# Patient Record
Sex: Male | Born: 1935 | Race: White | Hispanic: No | Marital: Married | Smoking: Never smoker
Health system: Southern US, Community
[De-identification: ages and names within clinical notes are randomized; demographics above are authoritative.]

## PROBLEM LIST (undated history)

## (undated) DIAGNOSIS — M1990 Unspecified osteoarthritis, unspecified site: Secondary | ICD-10-CM

## (undated) DIAGNOSIS — E119 Type 2 diabetes mellitus without complications: Secondary | ICD-10-CM

## (undated) DIAGNOSIS — C801 Malignant (primary) neoplasm, unspecified: Secondary | ICD-10-CM

## (undated) DIAGNOSIS — I519 Heart disease, unspecified: Secondary | ICD-10-CM

## (undated) HISTORY — DX: Unspecified osteoarthritis, unspecified site: M19.90

## (undated) HISTORY — DX: Type 2 diabetes mellitus without complications: E11.9

## (undated) HISTORY — DX: Malignant (primary) neoplasm, unspecified: C80.1

## (undated) HISTORY — DX: Heart disease, unspecified: I51.9

## (undated) HISTORY — PX: OTHER SURGICAL HISTORY: SHX169

---

## 2005-11-07 ENCOUNTER — Ambulatory Visit: Payer: Self-pay | Admitting: Unknown Physician Specialty

## 2008-10-24 ENCOUNTER — Emergency Department: Payer: Self-pay | Admitting: Emergency Medicine

## 2008-11-07 ENCOUNTER — Emergency Department: Payer: Self-pay | Admitting: Internal Medicine

## 2012-02-26 ENCOUNTER — Ambulatory Visit: Payer: Self-pay | Admitting: Internal Medicine

## 2012-09-17 ENCOUNTER — Encounter: Payer: Self-pay | Admitting: Nurse Practitioner

## 2012-09-17 ENCOUNTER — Encounter: Payer: Self-pay | Admitting: Cardiothoracic Surgery

## 2013-01-04 ENCOUNTER — Ambulatory Visit: Payer: Self-pay | Admitting: Internal Medicine

## 2013-01-10 ENCOUNTER — Emergency Department: Payer: Self-pay | Admitting: Emergency Medicine

## 2013-01-10 LAB — CBC
HCT: 38.9 % — ABNORMAL LOW (ref 40.0–52.0)
HGB: 13.2 g/dL (ref 13.0–18.0)
MCHC: 33.9 g/dL (ref 32.0–36.0)
MCV: 95 fL (ref 80–100)
RDW: 15.6 % — ABNORMAL HIGH (ref 11.5–14.5)
WBC: 12.2 10*3/uL — ABNORMAL HIGH (ref 3.8–10.6)

## 2013-01-10 LAB — URINALYSIS, COMPLETE
Bilirubin,UR: NEGATIVE
Glucose,UR: NEGATIVE mg/dL (ref 0–75)
Ketone: NEGATIVE
Leukocyte Esterase: NEGATIVE
Nitrite: NEGATIVE
Ph: 5 (ref 4.5–8.0)
Squamous Epithelial: NONE SEEN
WBC UR: NONE SEEN /HPF (ref 0–5)

## 2013-01-10 LAB — COMPREHENSIVE METABOLIC PANEL
Albumin: 3.6 g/dL (ref 3.4–5.0)
Alkaline Phosphatase: 61 U/L (ref 50–136)
Anion Gap: 16 (ref 7–16)
Bilirubin,Total: 0.7 mg/dL (ref 0.2–1.0)
Co2: 17 mmol/L — ABNORMAL LOW (ref 21–32)
EGFR (African American): 43 — ABNORMAL LOW
EGFR (Non-African Amer.): 37 — ABNORMAL LOW
SGOT(AST): 23 U/L (ref 15–37)
Sodium: 137 mmol/L (ref 136–145)
Total Protein: 7 g/dL (ref 6.4–8.2)

## 2013-01-10 LAB — TROPONIN I: Troponin-I: <0.02 ng/mL

## 2013-01-14 ENCOUNTER — Ambulatory Visit: Payer: Self-pay | Admitting: Internal Medicine

## 2013-01-18 ENCOUNTER — Inpatient Hospital Stay: Payer: Self-pay | Admitting: Internal Medicine

## 2013-01-18 LAB — CBC
HGB: 15.3 g/dL (ref 13.0–18.0)
MCH: 31.2 pg (ref 26.0–34.0)
MCHC: 32.9 g/dL (ref 32.0–36.0)
MCV: 95 fL (ref 80–100)
Platelet: 165 10*3/uL (ref 150–440)
RBC: 4.92 10*6/uL (ref 4.40–5.90)

## 2013-01-18 LAB — URINALYSIS, COMPLETE
Bilirubin,UR: NEGATIVE
Hyaline Cast: 4
Leukocyte Esterase: NEGATIVE
Ph: 5 (ref 4.5–8.0)
Protein: 30
RBC,UR: <1 /HPF (ref 0–5)
Squamous Epithelial: NONE SEEN
WBC UR: 1 /HPF (ref 0–5)

## 2013-01-18 LAB — COMPREHENSIVE METABOLIC PANEL
Albumin: 3.2 g/dL — ABNORMAL LOW (ref 3.4–5.0)
Anion Gap: 19 — ABNORMAL HIGH (ref 7–16)
Bilirubin,Total: 0.5 mg/dL (ref 0.2–1.0)
Chloride: 102 mmol/L (ref 98–107)
Creatinine: 3.71 mg/dL — ABNORMAL HIGH (ref 0.60–1.30)
EGFR (Non-African Amer.): 14 — ABNORMAL LOW
Glucose: 144 mg/dL — ABNORMAL HIGH (ref 65–99)
Osmolality: 322 (ref 275–301)

## 2013-01-18 LAB — TROPONIN I: Troponin-I: <0.02 ng/mL

## 2013-01-19 LAB — CBC WITH DIFFERENTIAL/PLATELET
Basophil #: 0 10*3/uL (ref 0.0–0.1)
Basophil %: 0.1 %
Eosinophil #: 0 10*3/uL (ref 0.0–0.7)
Eosinophil %: 0.3 %
Lymphocyte #: 0.7 10*3/uL — ABNORMAL LOW (ref 1.0–3.6)
MCV: 93 fL (ref 80–100)
Monocyte %: 10.1 %
Neutrophil #: 10.1 10*3/uL — ABNORMAL HIGH (ref 1.4–6.5)
Neutrophil %: 83.4 %
Platelet: 124 10*3/uL — ABNORMAL LOW (ref 150–440)
RBC: 4.08 10*6/uL — ABNORMAL LOW (ref 4.40–5.90)
WBC: 12.1 10*3/uL — ABNORMAL HIGH (ref 3.8–10.6)

## 2013-01-19 LAB — COMPREHENSIVE METABOLIC PANEL
Albumin: 2.4 g/dL — ABNORMAL LOW (ref 3.4–5.0)
Alkaline Phosphatase: 47 U/L — ABNORMAL LOW (ref 50–136)
Creatinine: 3.19 mg/dL — ABNORMAL HIGH (ref 0.60–1.30)
EGFR (African American): 19 — ABNORMAL LOW
Glucose: 100 mg/dL — ABNORMAL HIGH (ref 65–99)
Osmolality: 326 (ref 275–301)
Potassium: 4.7 mmol/L (ref 3.5–5.1)
SGOT(AST): 11 U/L — ABNORMAL LOW (ref 15–37)
SGPT (ALT): 16 U/L (ref 12–78)
Sodium: 141 mmol/L (ref 136–145)

## 2013-01-20 LAB — PHOSPHORUS: Phosphorus: 3.6 mg/dL (ref 2.5–4.9)

## 2013-01-20 LAB — CBC WITH DIFFERENTIAL/PLATELET
Eosinophil #: 0 10*3/uL (ref 0.0–0.7)
Eosinophil %: 0.4 %
HCT: 38 % — ABNORMAL LOW (ref 40.0–52.0)
HGB: 13 g/dL (ref 13.0–18.0)
Lymphocyte #: 0.8 10*3/uL — ABNORMAL LOW (ref 1.0–3.6)
Lymphocyte %: 7.3 %
MCHC: 34.2 g/dL (ref 32.0–36.0)
MCV: 93 fL (ref 80–100)
Neutrophil #: 9.1 10*3/uL — ABNORMAL HIGH (ref 1.4–6.5)
Neutrophil %: 81.3 %
RBC: 4.1 10*6/uL — ABNORMAL LOW (ref 4.40–5.90)
RDW: 15.2 % — ABNORMAL HIGH (ref 11.5–14.5)
WBC: 11.2 10*3/uL — ABNORMAL HIGH (ref 3.8–10.6)

## 2013-01-20 LAB — COMPREHENSIVE METABOLIC PANEL
Albumin: 2.4 g/dL — ABNORMAL LOW (ref 3.4–5.0)
Alkaline Phosphatase: 57 U/L (ref 50–136)
Bilirubin,Total: 0.5 mg/dL (ref 0.2–1.0)
Calcium, Total: 6.7 mg/dL — CL (ref 8.5–10.1)
Co2: 18 mmol/L — ABNORMAL LOW (ref 21–32)
Creatinine: 2.41 mg/dL — ABNORMAL HIGH (ref 0.60–1.30)
EGFR (African American): 27 — ABNORMAL LOW
EGFR (Non-African Amer.): 23 — ABNORMAL LOW
Glucose: 220 mg/dL — ABNORMAL HIGH (ref 65–99)
Potassium: 3.6 mmol/L (ref 3.5–5.1)
SGOT(AST): 29 U/L (ref 15–37)
SGPT (ALT): 18 U/L (ref 12–78)
Sodium: 143 mmol/L (ref 136–145)

## 2013-01-21 LAB — CBC WITH DIFFERENTIAL/PLATELET
Basophil #: 0 10*3/uL (ref 0.0–0.1)
Eosinophil #: 0.1 10*3/uL (ref 0.0–0.7)
Eosinophil %: 1.1 %
HCT: 37.4 % — ABNORMAL LOW (ref 40.0–52.0)
HGB: 12.2 g/dL — ABNORMAL LOW (ref 13.0–18.0)
Lymphocyte #: 0.8 10*3/uL — ABNORMAL LOW (ref 1.0–3.6)
MCH: 30 pg (ref 26.0–34.0)
MCV: 92 fL (ref 80–100)
Monocyte #: 0.7 x10 3/mm (ref 0.2–1.0)
Monocyte %: 9.2 %
Neutrophil %: 77.4 %

## 2013-01-21 LAB — COMPREHENSIVE METABOLIC PANEL
Albumin: 2.2 g/dL — ABNORMAL LOW (ref 3.4–5.0)
Bilirubin,Total: 0.5 mg/dL (ref 0.2–1.0)
Calcium, Total: 6.8 mg/dL — CL (ref 8.5–10.1)
Chloride: 109 mmol/L — ABNORMAL HIGH (ref 98–107)
Creatinine: 1.23 mg/dL (ref 0.60–1.30)
EGFR (African American): 60 — ABNORMAL LOW
Osmolality: 306 (ref 275–301)
Potassium: 2.7 mmol/L — ABNORMAL LOW (ref 3.5–5.1)
SGPT (ALT): 18 U/L (ref 12–78)
Sodium: 146 mmol/L — ABNORMAL HIGH (ref 136–145)
Total Protein: 4.9 g/dL — ABNORMAL LOW (ref 6.4–8.2)

## 2013-01-22 LAB — COMPREHENSIVE METABOLIC PANEL
Albumin: 2 g/dL — ABNORMAL LOW (ref 3.4–5.0)
Alkaline Phosphatase: 53 U/L (ref 50–136)
Anion Gap: 3 — ABNORMAL LOW (ref 7–16)
BUN: 23 mg/dL — ABNORMAL HIGH (ref 7–18)
Bilirubin,Total: 0.5 mg/dL (ref 0.2–1.0)
Creatinine: 1 mg/dL (ref 0.60–1.30)
Glucose: 149 mg/dL — ABNORMAL HIGH (ref 65–99)
Osmolality: 299 (ref 275–301)
SGOT(AST): 27 U/L (ref 15–37)
Sodium: 147 mmol/L — ABNORMAL HIGH (ref 136–145)
Total Protein: 4.8 g/dL — ABNORMAL LOW (ref 6.4–8.2)

## 2013-01-22 LAB — CBC WITH DIFFERENTIAL/PLATELET
Lymphocytes: 12 %
MCH: 31.6 pg (ref 26.0–34.0)
MCHC: 33.5 g/dL (ref 32.0–36.0)
MCV: 94 fL (ref 80–100)
Platelet: 82 10*3/uL — ABNORMAL LOW (ref 150–440)
Segmented Neutrophils: 77 %
WBC: 5.5 10*3/uL (ref 3.8–10.6)

## 2013-01-22 LAB — MAGNESIUM: Magnesium: 1.2 mg/dL — ABNORMAL LOW

## 2013-01-22 LAB — TSH: Thyroid Stimulating Horm: 0.42 u[IU]/mL — ABNORMAL LOW

## 2013-01-23 LAB — COMPREHENSIVE METABOLIC PANEL
Albumin: 2.2 g/dL — ABNORMAL LOW (ref 3.4–5.0)
BUN: 15 mg/dL (ref 7–18)
Bilirubin,Total: 0.5 mg/dL (ref 0.2–1.0)
Calcium, Total: 7.2 mg/dL — ABNORMAL LOW (ref 8.5–10.1)
Creatinine: 0.9 mg/dL (ref 0.60–1.30)
EGFR (Non-African Amer.): >60
Osmolality: 288 (ref 275–301)
Potassium: 3.2 mmol/L — ABNORMAL LOW (ref 3.5–5.1)

## 2013-01-23 LAB — CBC WITH DIFFERENTIAL/PLATELET
Basophil #: 0.1 10*3/uL (ref 0.0–0.1)
Lymphocyte #: 0.9 10*3/uL — ABNORMAL LOW (ref 1.0–3.6)
MCH: 31.9 pg (ref 26.0–34.0)
MCHC: 33.8 g/dL (ref 32.0–36.0)
Neutrophil #: 5.7 10*3/uL (ref 1.4–6.5)
Neutrophil %: 79.7 %
Platelet: 86 10*3/uL — ABNORMAL LOW (ref 150–440)
RBC: 3.97 10*6/uL — ABNORMAL LOW (ref 4.40–5.90)
RDW: 15 % — ABNORMAL HIGH (ref 11.5–14.5)
WBC: 7.2 10*3/uL (ref 3.8–10.6)

## 2013-01-23 LAB — CULTURE, BLOOD (SINGLE)

## 2013-01-24 LAB — CBC WITH DIFFERENTIAL/PLATELET
Basophil #: 0 10*3/uL (ref 0.0–0.1)
Basophil %: 0.5 %
Eosinophil #: 0.1 10*3/uL (ref 0.0–0.7)
HCT: 35.2 % — ABNORMAL LOW (ref 40.0–52.0)
HGB: 12.1 g/dL — ABNORMAL LOW (ref 13.0–18.0)
Lymphocyte #: 0.7 10*3/uL — ABNORMAL LOW (ref 1.0–3.6)
Lymphocyte %: 8.8 %
MCHC: 34.4 g/dL (ref 32.0–36.0)
MCV: 94 fL (ref 80–100)
Monocyte #: 0.4 x10 3/mm (ref 0.2–1.0)
Monocyte %: 5.1 %
Neutrophil #: 6.8 10*3/uL — ABNORMAL HIGH (ref 1.4–6.5)
Platelet: 89 10*3/uL — ABNORMAL LOW (ref 150–440)
RDW: 15.1 % — ABNORMAL HIGH (ref 11.5–14.5)

## 2013-01-24 LAB — COMPREHENSIVE METABOLIC PANEL
Albumin: 2.2 g/dL — ABNORMAL LOW (ref 3.4–5.0)
Alkaline Phosphatase: 58 U/L (ref 50–136)
BUN: 11 mg/dL (ref 7–18)
Bilirubin,Total: 0.4 mg/dL (ref 0.2–1.0)
Calcium, Total: 7.5 mg/dL — ABNORMAL LOW (ref 8.5–10.1)
Chloride: 109 mmol/L — ABNORMAL HIGH (ref 98–107)
Co2: 29 mmol/L (ref 21–32)
EGFR (African American): >60
EGFR (Non-African Amer.): >60
Potassium: 3.7 mmol/L (ref 3.5–5.1)
SGOT(AST): 21 U/L (ref 15–37)
SGPT (ALT): 29 U/L (ref 12–78)

## 2013-01-25 LAB — CBC WITH DIFFERENTIAL/PLATELET
Basophil #: 0 10*3/uL (ref 0.0–0.1)
Basophil %: 0.3 %
Eosinophil #: 0.1 10*3/uL (ref 0.0–0.7)
Eosinophil %: 1 %
HCT: 38.4 % — ABNORMAL LOW (ref 40.0–52.0)
Lymphocyte %: 7.2 %
MCH: 31.9 pg (ref 26.0–34.0)
MCHC: 33.7 g/dL (ref 32.0–36.0)
MCV: 95 fL (ref 80–100)
Monocyte #: 0.6 x10 3/mm (ref 0.2–1.0)
Monocyte %: 5.6 %
Neutrophil #: 9.1 10*3/uL — ABNORMAL HIGH (ref 1.4–6.5)
Neutrophil %: 85.9 %
Platelet: 97 10*3/uL — ABNORMAL LOW (ref 150–440)
RBC: 4.05 10*6/uL — ABNORMAL LOW (ref 4.40–5.90)
WBC: 10.6 10*3/uL (ref 3.8–10.6)

## 2013-01-25 LAB — COMPREHENSIVE METABOLIC PANEL
Albumin: 2.3 g/dL — ABNORMAL LOW (ref 3.4–5.0)
Alkaline Phosphatase: 75 U/L (ref 50–136)
Anion Gap: 7 (ref 7–16)
BUN: 9 mg/dL (ref 7–18)
Bilirubin,Total: 0.4 mg/dL (ref 0.2–1.0)
Chloride: 107 mmol/L (ref 98–107)
Co2: 25 mmol/L (ref 21–32)
Creatinine: 0.96 mg/dL (ref 0.60–1.30)
Glucose: 156 mg/dL — ABNORMAL HIGH (ref 65–99)
Osmolality: 279 (ref 275–301)
Potassium: 4.6 mmol/L (ref 3.5–5.1)
Sodium: 139 mmol/L (ref 136–145)
Total Protein: 5.7 g/dL — ABNORMAL LOW (ref 6.4–8.2)

## 2013-01-26 LAB — COMPREHENSIVE METABOLIC PANEL
Albumin: 2.5 g/dL — ABNORMAL LOW (ref 3.4–5.0)
Alkaline Phosphatase: 166 U/L — ABNORMAL HIGH (ref 50–136)
BUN: 13 mg/dL (ref 7–18)
Bilirubin,Total: 1.1 mg/dL — ABNORMAL HIGH (ref 0.2–1.0)
Chloride: 108 mmol/L — ABNORMAL HIGH (ref 98–107)
Glucose: 170 mg/dL — ABNORMAL HIGH (ref 65–99)
Potassium: 5.8 mmol/L — ABNORMAL HIGH (ref 3.5–5.1)
SGOT(AST): 239 U/L — ABNORMAL HIGH (ref 15–37)
SGPT (ALT): 165 U/L — ABNORMAL HIGH (ref 12–78)
Sodium: 136 mmol/L (ref 136–145)
Total Protein: 6 g/dL — ABNORMAL LOW (ref 6.4–8.2)

## 2013-01-26 LAB — URINALYSIS, COMPLETE
Bilirubin,UR: NEGATIVE
Glucose,UR: 50 mg/dL (ref 0–75)
Hyaline Cast: 5
Nitrite: NEGATIVE
Ph: 5 (ref 4.5–8.0)
Protein: 100
RBC,UR: 1 /HPF (ref 0–5)
Specific Gravity: 1.021 (ref 1.003–1.030)
Squamous Epithelial: 1
WBC UR: 5 /HPF (ref 0–5)

## 2013-01-26 LAB — BASIC METABOLIC PANEL
BUN: 9 mg/dL (ref 7–18)
Co2: 25 mmol/L (ref 21–32)
EGFR (African American): >60
EGFR (Non-African Amer.): >60
Osmolality: 272 (ref 275–301)
Potassium: 5.2 mmol/L — ABNORMAL HIGH (ref 3.5–5.1)

## 2013-01-26 LAB — CBC WITH DIFFERENTIAL/PLATELET
Basophil #: 0 10*3/uL (ref 0.0–0.1)
Basophil #: 0 10*3/uL (ref 0.0–0.1)
Basophil %: 0.4 %
Basophil %: 0.1 %
Eosinophil #: 0 10*3/uL (ref 0.0–0.7)
Lymphocyte %: 7.4 %
MCHC: 32.8 g/dL (ref 32.0–36.0)
MCHC: 32.2 g/dL (ref 32.0–36.0)
MCV: 96 fL (ref 80–100)
Monocyte #: 0.8 x10 3/mm (ref 0.2–1.0)
Monocyte #: 1.1 x10 3/mm — ABNORMAL HIGH (ref 0.2–1.0)
Monocyte %: 5.7 %
Monocyte %: 8.2 %
Platelet: 102 10*3/uL — ABNORMAL LOW (ref 150–440)
Platelet: 112 10*3/uL — ABNORMAL LOW (ref 150–440)
RBC: 4.13 10*6/uL — ABNORMAL LOW (ref 4.40–5.90)
RDW: 15 % — ABNORMAL HIGH (ref 11.5–14.5)
WBC: 13.3 10*3/uL — ABNORMAL HIGH (ref 3.8–10.6)
WBC: 13.3 10*3/uL — ABNORMAL HIGH (ref 3.8–10.6)

## 2013-01-27 LAB — COMPREHENSIVE METABOLIC PANEL
Anion Gap: 6 — ABNORMAL LOW (ref 7–16)
Bilirubin,Total: 1.1 mg/dL — ABNORMAL HIGH (ref 0.2–1.0)
EGFR (African American): >60
EGFR (Non-African Amer.): >60
Glucose: 97 mg/dL (ref 65–99)
Osmolality: 278 (ref 275–301)
Potassium: 4.6 mmol/L (ref 3.5–5.1)
Sodium: 139 mmol/L (ref 136–145)
Total Protein: 5.3 g/dL — ABNORMAL LOW (ref 6.4–8.2)

## 2013-01-27 LAB — CBC WITH DIFFERENTIAL/PLATELET
Basophil #: 0 10*3/uL (ref 0.0–0.1)
Eosinophil %: 0.2 %
HCT: 38.6 % — ABNORMAL LOW (ref 40.0–52.0)
HGB: 11.8 g/dL — ABNORMAL LOW (ref 13.0–18.0)
Lymphocyte #: 0.9 10*3/uL — ABNORMAL LOW (ref 1.0–3.6)
Lymphocyte %: 7.6 %
Neutrophil #: 10.1 10*3/uL — ABNORMAL HIGH (ref 1.4–6.5)
WBC: 12 10*3/uL — ABNORMAL HIGH (ref 3.8–10.6)

## 2013-01-30 LAB — CBC WITH DIFFERENTIAL/PLATELET
Basophil #: 0 10*3/uL (ref 0.0–0.1)
Eosinophil #: 0.1 10*3/uL (ref 0.0–0.7)
HGB: 11.2 g/dL — ABNORMAL LOW (ref 13.0–18.0)
Lymphocyte #: 0.7 10*3/uL — ABNORMAL LOW (ref 1.0–3.6)
Lymphocyte %: 11.6 %
MCH: 30.8 pg (ref 26.0–34.0)
MCHC: 32.3 g/dL (ref 32.0–36.0)
Monocyte #: 0.5 x10 3/mm (ref 0.2–1.0)
Monocyte %: 7.9 %
Neutrophil #: 4.9 10*3/uL (ref 1.4–6.5)
Neutrophil %: 78.2 %
Platelet: 82 10*3/uL — ABNORMAL LOW (ref 150–440)
RDW: 15.6 % — ABNORMAL HIGH (ref 11.5–14.5)
WBC: 6.2 10*3/uL (ref 3.8–10.6)

## 2013-01-30 LAB — COMPREHENSIVE METABOLIC PANEL
Albumin: 2.2 g/dL — ABNORMAL LOW (ref 3.4–5.0)
Alkaline Phosphatase: 117 U/L (ref 50–136)
Anion Gap: 3 — ABNORMAL LOW (ref 7–16)
Bilirubin,Total: 0.5 mg/dL (ref 0.2–1.0)
Calcium, Total: 8.1 mg/dL — ABNORMAL LOW (ref 8.5–10.1)
Chloride: 106 mmol/L (ref 98–107)
Creatinine: 0.95 mg/dL (ref 0.60–1.30)
EGFR (African American): >60
EGFR (Non-African Amer.): >60
Glucose: 93 mg/dL (ref 65–99)
Osmolality: 273 (ref 275–301)
Sodium: 137 mmol/L (ref 136–145)

## 2013-02-04 ENCOUNTER — Ambulatory Visit: Payer: Self-pay | Admitting: Internal Medicine

## 2013-05-25 ENCOUNTER — Encounter: Payer: Self-pay | Admitting: Podiatry

## 2013-05-30 ENCOUNTER — Ambulatory Visit: Payer: Self-pay | Admitting: Podiatry

## 2013-10-24 ENCOUNTER — Encounter: Payer: Self-pay | Admitting: Podiatry

## 2013-10-24 ENCOUNTER — Ambulatory Visit (INDEPENDENT_AMBULATORY_CARE_PROVIDER_SITE_OTHER): Payer: Medicare Other | Admitting: Podiatry

## 2013-10-24 VITALS — BP 135/85 | HR 96 | Resp 18 | Ht 68.0 in | Wt 176.0 lb

## 2013-10-24 DIAGNOSIS — B351 Tinea unguium: Secondary | ICD-10-CM

## 2013-10-24 DIAGNOSIS — M79609 Pain in unspecified limb: Secondary | ICD-10-CM

## 2013-10-24 NOTE — Progress Notes (Signed)
   Subjective:    Patient ID: Troy Orr, male    DOB: 08/30/1921, 80 y.o.   MRN: [id number]  HPI Comments: Just a regular visit and i have a cut on my toe. Left great toe.  Diabetes      Review of Systems     Objective:   Physical Exam: I have reviewed his past history medications allergies surgeries social history. Pulses remain palpable bilateral there is no erythema edema saline is drainage or odor. His nails are thick yellow dystrophic onychomycotic and painful palpation.        Assessment & Plan:  Assessment: Pain in limb secondary to onychomycosis 1 through 5 bilateral.  Plan: Debridement of nails 1 through 5 bilateral is cover service secondary to pain.

## 2013-12-09 ENCOUNTER — Ambulatory Visit (INDEPENDENT_AMBULATORY_CARE_PROVIDER_SITE_OTHER): Payer: Medicare Other | Admitting: Podiatry

## 2013-12-09 ENCOUNTER — Encounter: Payer: Self-pay | Admitting: Podiatry

## 2013-12-09 VITALS — BP 125/86 | HR 99 | Resp 20

## 2013-12-09 DIAGNOSIS — L97509 Non-pressure chronic ulcer of other part of unspecified foot with unspecified severity: Secondary | ICD-10-CM

## 2013-12-09 NOTE — Progress Notes (Signed)
Subjective:     Patient ID: Troy Orr, male   DOB: 1921-12-21, 80 y.o.   MRN: [id number]  HPI patient presents stating he's concerned because there's been a blister with breakdown on his right big toe and he is a diabetic   Review of Systems     Objective:   Physical Exam Neurovascular status unchanged with some redness on the dorsum of the right hallux localized in nature with a previous blister with no indications currently of subcutaneous exposure or proximal signs of infection    Assessment:     Localized irritation of tissue with no indications of proximal spread    Plan:     Explained soaks and applied Silvadene with dressing today. Reappoint if anything changes

## 2013-12-30 ENCOUNTER — Encounter: Payer: Self-pay | Admitting: Surgery

## 2014-01-04 ENCOUNTER — Encounter: Payer: Self-pay | Admitting: Surgery

## 2014-01-09 ENCOUNTER — Encounter: Payer: Self-pay | Admitting: Surgery

## 2014-01-23 ENCOUNTER — Ambulatory Visit: Payer: Medicare Other | Admitting: Podiatry

## 2014-02-04 ENCOUNTER — Encounter: Payer: Self-pay | Admitting: Surgery

## 2014-04-14 ENCOUNTER — Ambulatory Visit (INDEPENDENT_AMBULATORY_CARE_PROVIDER_SITE_OTHER): Payer: Medicare Other | Admitting: Podiatry

## 2014-04-14 DIAGNOSIS — Q828 Other specified congenital malformations of skin: Secondary | ICD-10-CM

## 2014-04-14 DIAGNOSIS — E1151 Type 2 diabetes mellitus with diabetic peripheral angiopathy without gangrene: Secondary | ICD-10-CM

## 2014-04-14 DIAGNOSIS — B351 Tinea unguium: Secondary | ICD-10-CM

## 2014-04-14 DIAGNOSIS — M79676 Pain in unspecified toe(s): Secondary | ICD-10-CM

## 2014-04-14 NOTE — Progress Notes (Signed)
Subjective:     Patient ID: Erez W Jimmerson, male   DOB: 06/18/1922, 80 y.o.   MRN: [id number]  HPI patient presents stating that I have these nails they get sore and this lesion on my right fifth metatarsal and I have been at the wound care center and I'm finally doing well   Review of Systems     Objective:   Physical Exam Neurovascular status diminished but unchanged and he's been seen at the vein in vascular center with thick toenail disease 1-5 both and keratotic lesion sub-fifth metatarsal that's very tender when pressed    Assessment:     Plantarflexed metatarsal with keratotic lesion and nail disease with pain 1-5 both feet    Plan:     Debris painful nailbeds 1-5 both feet and lesion plantar aspect right foot with no iatrogenic bleeding noted      

## 2014-07-14 ENCOUNTER — Other Ambulatory Visit: Payer: Medicare Other

## 2014-07-24 ENCOUNTER — Ambulatory Visit (INDEPENDENT_AMBULATORY_CARE_PROVIDER_SITE_OTHER): Payer: Medicare Other | Admitting: Podiatry

## 2014-07-24 DIAGNOSIS — M79676 Pain in unspecified toe(s): Secondary | ICD-10-CM

## 2014-07-24 DIAGNOSIS — B351 Tinea unguium: Secondary | ICD-10-CM | POA: Diagnosis not present

## 2014-07-24 NOTE — Progress Notes (Signed)
Subjective:     Patient ID: Troy Orr, male   DOB: [birth year], 81 y.o.   MRN: [id number]  HPI patient presents stating that I have these nails they get sore and this lesion on my right fifth metatarsal and I have been at the wound care center and I'm finally doing well   Review of Systems     Objective:   Physical Exam Neurovascular status diminished but unchanged and he's been seen at the vein in vascular center with thick toenail disease 1-5 both and keratotic lesion sub-fifth metatarsal that's very tender when pressed    Assessment:     Plantarflexed metatarsal with keratotic lesion and nail disease with pain 1-5 both feet    Plan:     Debris painful nailbeds 1-5 both feet and lesion plantar aspect right foot with no iatrogenic bleeding noted

## 2014-10-23 ENCOUNTER — Ambulatory Visit: Payer: Medicare Other

## 2014-10-23 ENCOUNTER — Ambulatory Visit (INDEPENDENT_AMBULATORY_CARE_PROVIDER_SITE_OTHER): Payer: Medicare Other | Admitting: Podiatry

## 2014-10-23 DIAGNOSIS — M79676 Pain in unspecified toe(s): Secondary | ICD-10-CM

## 2014-10-23 DIAGNOSIS — B351 Tinea unguium: Secondary | ICD-10-CM | POA: Diagnosis not present

## 2014-10-23 MED ORDER — AMOXICILLIN-POT CLAVULANATE 500-125 MG PO TABS: 1.0000 | ORAL_TABLET | Freq: Three times a day (TID) | ORAL | Status: DC

## 2014-10-23 MED ORDER — MUPIROCIN 2 % EX OINT: TOPICAL_OINTMENT | CUTANEOUS | Status: DC

## 2014-10-23 NOTE — Progress Notes (Signed)
Subjective:     Patient ID: Troy Orr, male   DOB: 02/04/1922, 81 y.o.   MRN: [id number]  HPI patient presents stating that I have these nails they get sore and this lesion on my right fifth metatarsal and I have been at the wound care center and I'm finally doing well   Review of Systems     Objective:   Physical Exam Neurovascular status diminished but unchanged and he's been seen at the vein in vascular center with thick toenail disease 1-5 both and keratotic lesion sub-fifth metatarsal that's very tender when pressed    Assessment:     Plantarflexed metatarsal with keratotic lesion and nail disease with pain 1-5 both feet    Plan:     Debris painful nailbeds 1-5 both feet and lesion plantar aspect right foot with no iatrogenic bleeding noted

## 2014-10-27 NOTE — Consult Note (Signed)
   Comments   I met with pt's son, Caide Campi. Updated him on pt's current medical condition including AM labs. Son understands that pt is approaching the end of life. I discussed transitioning pt to comfort care and son is in agreement. Orders entered.   Electronic Signatures: Nadiah Corbit, Izora Gala (MD)  (Signed 24-Jul-14 13:42)  Authored: Palliative Care   Last Updated: 24-Jul-14 13:42 by Ramir Malerba, Izora Gala (MD)

## 2014-10-27 NOTE — Consult Note (Signed)
Brief Consult Note: Diagnosis: elevated LFTs, acute renal failure, AMS, weakness.   Patient was seen by consultant.   Consult note dictated.   Orders entered.   Discussed with Attending MD.   Comments: Patient seen and examined. Patient has been inpt since 01/18/13 for workup and treatment of acute renal failure, with little urine output, weakness, and nausea. LFTs have been WNL up until yesterday. Transaminitis began yesterday, and continued to increase today. AlkPhos and Bili remain WNL. No remarkable WBC count. VSS.  [place] was obtained noting mobile gallstones, but CBD 6.36mm and WNL, no pericholecystic fluid, unremarkable liver contour.  Differential diagnosis was discussed. may be Medication/Drug-induced, also can consider Ischemic injury/early liver failure considering the patient has been having intermittent Hypotension since admission.  Recc an Ultrasound Doppler, r/o thombus. Will also check PT/INR, ammonia level. Repeat Liver panel in the am.  Will follow closely.  Case discussed with Dr Rayann Heman. Full consult being dictated.  ADDENDUM: Received call to cancel GI consult due to clinical decline and pt return to comfort care/hospice, but consult already completed. Will sign off on the case, but we will be happy to reconsult should his clinical picture change. thanks..  Electronic Signatures: Loren Racer M (PA-C)  (Signed 24-Jul-14 15:17)  Authored: Brief Consult Note   Last Updated: 24-Jul-14 15:17 by Sherald Barge (PA-C)

## 2014-10-27 NOTE — Discharge Summary (Signed)
PATIENT NAME:  Troy Orr, Troy Orr MR#:  [id number] DATE OF BIRTH:  07/03/1922  DATE OF ADMISSION:  01/18/2013 DATE OF DISCHARGE:  01/31/2013  DISCHARGE DIAGNOSES: 1.  Acute small bowel obstruction, partial.  2.  Abnormal liver function tests.  3.  Chronic atrial fibrillation.  4.  Diabetes mellitus, non-insulin-requiring.  5.  Cardiomyopathy, ejection fraction of 40%.   DISCHARGE MEDICATIONS:  Aspirin 81 mg daily. Metoprolol succinate 50 mg daily. Omeprazole 20 mg daily.   REASON FOR ADMISSION:  An 81 year old male presents with severe abdominal pain. Please see H and P for HPI, past medical history and physical exam.   HOSPITAL COURSE: The patient was admitted, found to have possible partial small bowel obstruction. His LFTs went up into the mid 100s. The etiology was uncertain. His medications were held. He was actually comfort care, but then he turned the corner. His bowels improved. His LFTs started coming down. His mental status abnormalities resolved, other than still being weak. His appetite is good, and he is making what seems to be a reasonably fair full recovery. He will need skilled nursing to strengthen him with his still slightly abnormal LFTs, around 60 ALT. His Januvia and cholesterol medicine will be held. His blood pressure was lowish, and thus, he is not on his Ramipril or his Norvasc at this point. With the fact that this could have been intestinal ischemia, his aspirin is re-added with his omeprazole. Sugars will be followed. Metformin 500 mg q.a.m. added if his fasting sugars go over 140. Overall prognosis is guarded.   ____________________________ Rusty Aus, MD mfm:dmm D: 01/31/2013 09:24:16 ET T: 01/31/2013 09:31:02 ET JOB#: [id number]  cc: Rusty Aus, MD, <Dictator> Kinnick Maus Roselee Culver MD ELECTRONICALLY SIGNED 02/01/2013 7:55

## 2014-10-27 NOTE — H&P (Signed)
PATIENT NAME:  Troy Orr, Troy Orr MR#:  [id number] DATE OF BIRTH:  12-31-1921  DATE OF ADMISSION:  01/18/2013  PRIMARY CARE PHYSICIAN: Dr. Georgie Chard.   CHIEF COMPLAINT: Weakness, nausea.   HISTORY OF PRESENT ILLNESS: This is an 81 year old male who resides at [facility] assisted [facility] who was brought to the Emergency Room because of progressive nausea with weakness. Had been seen previously on the 7th of this month with similar symptoms, found to have mildly acute renal failure with a creatinine of 1.61, but presented this time with progress renal failure with a creatinine of 3.71 and BUN of 160, and also found to be in metabolic acidosis with a CO2 of 12, and a lactic acid of 1.9 and a potassium 5.8. At that point, we were called to admit the patient for further evaluation and treatment. The patient was also found to be hypotensive with systolic blood pressures in the 70s to 25E and diastolic in the 52D, despite 2 liters of normal saline bolus. Family member and patient denied any altered mental status changes. He does endorse decreased urine output over the past several days. Denies any dysuria or hematuria, and no significant abdominal discomfort. During his ED stay, a CT of the abdomen was obtained and did show concerns for possible obstruction. Surgery was consulted, evaluated the patient, and thought that this was more due to ileus and no significant obstruction. An NG tube was placed, and surgery has signed off otherwise.   PAST MEDICAL HISTORY: 1.  Type 2 diabetes followed by Dr. Gabriel Carina.  2.  Also history of coronary artery disease, status post CABG and mitral valve repair in 1995.  3.  History of colon cancer.  4.  History of cataracts.  5.  History of hyperlipidemia.  6.  History of hypertension.  7.  History of spinal stenosis.   MEDICATIONS:  1.  Aspirin 81 mg daily.  2.  Ramipril 1.25 mg daily.  3.  Lopressor 50 mg b.i.d.  4.  Norvasc 5 mg daily.  5.  Simvastatin 40 mg at  bedtime.  6.  Metformin 1000 mg b.i.d.  7.  Omeprazole 20 mg daily.  8.  Januvia 50 mg daily.  9.  B12 injections.  10.  Omega-3 as directed.  11.  Meclizine 25 mg q.8 hours as needed.   ALLERGIES: No known drug allergies.   SOCIAL HISTORY: He is a widower, resides at [facility] assisted living. No tobacco use and no alcohol use currently.   FAMILY HISTORY: Positive for hypertension and stroke.   REVIEW OF SYSTEMS: As stated above in the history of present illness.   PHYSICAL EXAMINATION: VITAL SIGNS: Temperature 97.8, pulse 78E, systolic blood pressure 70 to 42P, diastolic 53I. He is satting 99% on room air.  GENERAL: This is an elderly white male in no apparent distress, who awakens to voice, and is alert and oriented to person, place and time.  HEENT: Extraocular movements are intact. Pupils are equal and reactive to light and accommodation.  Supple neck exam. Mildly dry oral mucosa.  CARDIOVASCULAR: Regular rate and rhythm. No significant murmurs.  RESPIRATORY: Clear to auscultation bilaterally. No wheezing. No increased work of breathing.  ABDOMEN: Quite distended but soft when palpated, and nontender. No rebound or guarding. NEUROLOGIC:  Again, alert and oriented x 3. Cranial nerves seem to be intact. Has motor control throughout. Sensation seems to be intact.  SKIN: No cyanosis. Normal color. Slight tent and turgor.   PERTINENT LABORATORY AND STUDIES: CT  of the abdomen was performed, did show concerns for possible small bowel dilatation, but no obstructive uropathy. Also had a chest x-ray, showed no acute processes. White blood cell count 16.9, hemoglobin 15.3, platelets of 165. Urinalysis was negative. Sodium 133, potassium 5.8, chloride 102, CO2 of 12, BUN of 160, creatinine 3.71, glucose 144. LFTs within normal limits. Troponin less than 0.02. Lactic acid 1.9.   ASSESSMENT AND PLAN: This is an 81 year old male being admitted for acute progressive renal failure, found to be in  metabolic acidosis and hypotensive with leukocytosis.  1.  Acute renal failure. Unclear etiology at this point. Seems to be progressive.  Last creatinine  on that 01/10/2013 was 1.61. Prior to that, a creatinine on 12/02/2012 was 1.0. Today's creatinine is 3.71 with a BUN of 160. This could be acute tubular necrosis. It could be obstructive, and it also could be  nephritis. Based on his exam, it is concerning for hypovolemia, prerenal. We plan to hydrate him with IV fluids, aggressive hydration at this time. We can use pressors as needed to keep his blood pressure up to perfuse the kidneys. Nephrology has been consulted, and Dr. Candiss Norse has agreed to see the patient. I plan to hold all nephrotoxic medications at this time. Renal ultrasound has been ordered for further evaluation to rule out any obstructive pathology.  2.  Hypotension. Unclear reason for his hypotensive state, sepsis versus hypovolemia. He is being covered with vanc and Zosyn for possible sepsis at this time. He is also being hydrated with normal saline IV fluid boluses. We will hold all hypertensive meds at this time.   3.  Metabolic acidosis. Unclear etiology at this time, likely due to underlying renal failure. We will follow this along as we hydrate him.  4.  Adult onset diabetes. We will hold all oral meds at this time, cover him with sliding scale insulin. He is n.p.o. with possible ileus; therefore, he has NG tube in place.  5.  Fluids, electrolytes, nutrition: The patient is to be n.p.o. Cover with normal saline IV fluids.  6. Prophylaxis. We will cover her PPI via IV. We will also cover with heparin for DVT prophylaxis.   DISPOSITION: I had long discussion with the patient regarding CODE STATUS. He is a DNR/DNI. He is willing to undergo pressors if needed. Given his critical state, I am going to place him in the CCU for close observation and monitoring.  We will turn the patient over to Dr. Doy Hutching in the morning.     ____________________________ Dion Body, MD kl:dmm D: 01/18/2013 20:09:53 ET T: 01/18/2013 20:36:43 ET JOB#: [id number]  cc: Dion Body, MD, <Dictator> Dion Body MD ELECTRONICALLY SIGNED 01/31/2013 11:03

## 2014-10-27 NOTE — Consult Note (Signed)
Brief Consult Note: Diagnosis: renal failure, ileus.   Patient was seen by consultant.   Consult note dictated.   Recommend further assessment or treatment.   Discussed with Attending MD.   Comments: Renal failure and acidosis with nml lactate and no abd pain. Passing gas, had BM yesterday. Occasional small volume emesis over last 2 weeks. CT rev'd, pt examined. No obvious obstruction and suspect ileus. Discussed with Drf Lord. Pt does need NG and reexamination.  Electronic Signatures: Florene Glen (MD)  (Signed 15-Jul-14 18:13)  Authored: Brief Consult Note   Last Updated: 15-Jul-14 18:13 by Florene Glen (MD)

## 2014-10-27 NOTE — Consult Note (Signed)
PATIENT NAME:  Troy Orr, Troy Orr MR#:  [id number] DATE OF BIRTH:  07-Jun-1922  DATE OF CONSULTATION:    REFERRING PHYSICIAN:  Leonie Douglas. Doy Hutching, MD CONSULTING PHYSICIAN:  Corey Skains, MD  REASON FOR CONSULTATION: Atrial fibrillation with coronary artery disease, valvular heart disease and previous myocardial infarction.   CHIEF COMPLAINT: The patient is somewhat obtunded and recently having infection with hypotension and cannot converse about symptoms.   The patient has had recent hospitalization for illness and weakness, fatigue, as well as significant infection. At this time, the patient has had a new onset of atrial fibrillation with rapid ventricular rate to 130 beats per minute. There have been no symptoms of chest discomfort, other weakness and fatigue, or other concerns. Troponin levels have been normal without evidence of myocardial infarction. The patient has had no evidence of congestive heart failure, although has coronary artery disease, hypertension, hyperlipidemia, which have been a nonissue during this hospitalization. The patient does have a coronary artery bypass surgery with mitral ring repair of mitral regurgitation. The patient has had slightly better blood pressure since hydration. In addition to that, the patient also has had improvements of heart rate control with intravenous beta blocker.   REVIEW OF SYSTEMS: The remainder of review of systems cannot be assessed.   PAST MEDICAL HISTORY: 1.  Coronary artery disease, status post coronary artery bypass graft.  2.  Atrial fibrillation.  3.  Hypertension.  4.  Hyperlipidemia.  5.  Valvular heart disease.  6.  Anemia.   FAMILY HISTORY: No family members with early onset of cardiovascular disease or hypertension.   SOCIAL HISTORY: The patient currently denies alcohol or tobacco use.   ALLERGIES: AS LISTED.   MEDICATIONS: As listed.   PHYSICAL EXAMINATION: VITAL SIGNS: Blood pressure is 100/60. Heart rate is 110  and irregular.  GENERAL: He is an ill-appearing elderly male in no acute distress.  HEAD, EYES, EARS, NOSE, THROAT: No icterus, thyromegaly, ulcers, hemorrhage or xanthelasma.  CARDIOVASCULAR: Irregularly irregular with normal S1, S2, II/VI apical murmur consistent with mitral regurgitation. PMI is diffuse. Carotid upstroke normal without bruit. Jugular venous pressure is normal.  LUNGS: Bibasilar crackles and expiratory wheezes with a few rhonchi.  ABDOMEN: Soft, nontender. Cannot assess hepatosplenomegaly or masses due to increased abdominal girth and edema. EXTREMITIES: Show 2+ radial, femoral and dorsal pedal pulses with trace lower extremity edema. No cyanosis, clubbing or ulcers.  NEUROLOGIC: The patient is not oriented to time, place or person.  ASSESSMENT: An 81 year old male with valvular heart disease, hypertension, hyperlipidemia, coronary artery disease, status post coronary artery bypass graft, with new onset of atrial fibrillation likely secondary to infection and current illness with hypotension, needing further treatment options.   RECOMMENDATIONS: 1.  No anticoagulation due to concerns of anemia and bleeding complications with current illness.  2.  Continue intravenous metoprolol due to patient unable to take oral medications and would further consider intravenous Cardizem drip as necessary for heart rate below 110 beats per minute, ideally 100 beats per minute.  3.  Echocardiogram for LV systolic dysfunction, valvular heart disease and further concerns of potential for cardiovascular disease needing adjustments of medications and involvement in current illness and illness. 4.  Continue treatment of infection, abdominal discomfort and failure to thrive.  5.  Further diagnostic testing and treatment options after above.   ____________________________ Corey Skains, MD bjk:jm D: 01/21/2013 17:58:01 ET T: 01/21/2013 20:02:09 ET JOB#: [id number]  cc: Corey Skains, MD,  <Dictator> Corey Skains MD  ELECTRONICALLY SIGNED 01/22/2013 10:34

## 2014-10-27 NOTE — Consult Note (Signed)
PATIENT NAME:  Troy Orr, Troy Orr MR#:  [id number] DATE OF BIRTH:  07/27/1921  DATE OF CONSULTATION:  01/27/2013  REFERRING PHYSICIAN:  Jeffrey Sparks, MD   CONSULTING PHYSICIAN:  Matthew Rein, MD/Karyn M. Earle, PA-C  REASON FOR CONSULTATION: Abnormal liver function tests.   HISTORY OF PRESENT ILLNESS: This is an 81-year-old gentleman who has been inpatient since 01/18/2013, initially with acute renal failure with some weakness and nausea. He has been inpatient managing the above issues in addition to some intermittent hypotension. His LFTs throughout hospitalization have been unremarkable up until yesterday where he began experiencing transaminitis. These had increased even more so today when compared to yesterday. Bilirubin and alk phos are within normal limits. White count is within normal limits. An ultrasound of the abdomen was obtained earlier this morning to evaluate, and findings were consistent with mobile gallstones. Common bile duct is 6.9 mm and within normal limits. No evidence of pericholecystic fluid and no remarkable findings of the liver. The patient does have poor mentation currently, and he is otherwise difficult to get a history from in regards to whether or not he has been experiencing any pain, nausea or vomiting. Currently, he is afebrile and his vital signs are stable. Blood pressure is 132/94.   PAST MEDICAL HISTORY: Coronary artery disease, diabetes mellitus type 2, history of colon cancer, cataract, dyslipidemia, hypertension and spinal stenosis.   PAST SURGICAL HISTORY: CABG.   SOCIAL HISTORY: The patient resides at [facility], otherwise negative for alcohol, tobacco or illicit drug use.   HOME MEDICATIONS: Baby aspirin, ramipril, Lopressor, Norvasc, simvastatin, metformin, omeprazole, Januvia, B12, omega-3 and meclizine.   FAMILY HISTORY: Unobtainable due to the patient's poor mental status.   ALLERGIES: No known drug allergies.   REVIEW OF SYSTEMS:  Unobtainable due to patient's current mental status.   PHYSICAL EXAMINATION:  VITAL SIGNS: Blood pressure 132/94, temperature 94.1, heart rate 98, respirations 16, bedside pulse oximetry 99%.  GENERAL: This is a pleasant 81-year-old gentleman resting in bed who does appear to be alert but difficult to tell if he is oriented or not. He is not verbalizing any words during my interaction with him.  HEENT: Head: Atraumatic, normocephalic. Sclerae anicteric.  Mucous membranes moist. NECK: Supple. No lymphadenopathy noted.  PULMONARY: Respirations are even and unlabored, clear to auscultation bilateral anterior lung fields.  CARDIAC: Regular rate and rhythm. S1, S2 noted.  ABDOMEN: Soft, nontender, nondistended.  RECTAL: Deferred.  PSYCHIATRIC: Poor mental status.  EXTREMITIES: Negative for lower extremity edema, 2+ pulses noted bilaterally.   LABORATORY DATA: White blood cells 12, hemoglobin 11.8, hematocrit 38.6, platelets 84. MCV 95. Sodium 139, potassium 4.6, BUN 13, creatinine 0.96, glucose 97, bilirubin 1.1, alk phos 190, ALT 369, AST 516. C. diff is pending.   IMAGING:  CT the abdomen and pelvis without contrast was notable for sigmoid under distention versus infectious inflammatory process but also some small bowel distention resolution when compared to prior exams.  CT of the head was obtained showing no acute changes, but there was evidence of atrophy.  Ultrasound of the abdomen was obtained this morning showing mobile gallstones. Common bile duct was 6.9 mm and within normal limits. Gallbladder wall thickness was 3.2. No evidence of pericholecystic fluid. The liver was unremarkable.   ASSESSMENT: 1.  Transaminitis, new onset.  2.  Altered mental status.  3.  Acute on chronic renal failure.  4.  Hypotension.   PLAN: I have discussed this patient's case in detail with Dr. Matthew Rein,   who is involved in the development of the patient's plan of care. At the time of this dictation, we  have just received a phone call notifying us that this patient has returned to [place].  On our initial evaluation, we were considering ordering an ultrasound Doppler on this patient and checking an INR and ammonia level to evaluate the status of his liver. A differential diagnosis was discussed, and this could certainly be drug-induced versus ischemic injury versus early liver failure considering he has been somewhat hypotensive throughout hospitalization. We can certainly hold off on the ultrasound Doppler for now because he has returned to [place], and should any of his clinical status change or our GI services are once again requested, we will be happy to reconsult. At this time, we will sign off on the patient and will follow along with the patient only upon request.   Thank you so much for this consultation and for allowing [place] to participate with his plan of care.    ____________________________ Corky Sox. Merina Behrendt, PA-C kme:cb D: 01/27/2013 15:12:58 ET T: 01/27/2013 16:04:29 ET JOB#: [id number]  cc: Corky Sox. Beckie Viscardi, PA-C, <Dictator> Wann PA ELECTRONICALLY SIGNED 01/28/2013 12:42

## 2014-11-06 ENCOUNTER — Ambulatory Visit (INDEPENDENT_AMBULATORY_CARE_PROVIDER_SITE_OTHER): Payer: Medicare Other | Admitting: Podiatry

## 2014-11-06 ENCOUNTER — Encounter: Payer: Self-pay | Admitting: Podiatry

## 2014-11-06 VITALS — BP 122/68 | HR 79 | Resp 16

## 2014-11-06 DIAGNOSIS — L89891 Pressure ulcer of other site, stage 1: Secondary | ICD-10-CM

## 2014-11-06 DIAGNOSIS — L97511 Non-pressure chronic ulcer of other part of right foot limited to breakdown of skin: Secondary | ICD-10-CM

## 2014-11-06 NOTE — Progress Notes (Signed)
He presents today for reevaluation of a abscess subcutaneous metatarsophalangeal joint of the right foot. This abscess/ulceration was superficial and limited the skin only. He states that it seems to be doing much better and he's been soaking it daily.  Objective: Vital signs are stable alert and oriented 3. Pulses are strongly palpable. Ulceration sub-fifth metatarsophalangeal joint appears to be healing well it is limited to superficial skin only see no signs of infection at this point.  Assessment: Well-healing ulceration fifth right.  Plan: Re-debrided today to bleeding and I will follow-up with him in 2 months

## 2014-12-25 ENCOUNTER — Ambulatory Visit: Payer: Medicare Other | Admitting: Podiatry

## 2015-01-03 ENCOUNTER — Ambulatory Visit (INDEPENDENT_AMBULATORY_CARE_PROVIDER_SITE_OTHER): Payer: Medicare Other | Admitting: Podiatry

## 2015-01-03 DIAGNOSIS — M79676 Pain in unspecified toe(s): Secondary | ICD-10-CM | POA: Diagnosis not present

## 2015-01-03 DIAGNOSIS — L97511 Non-pressure chronic ulcer of other part of right foot limited to breakdown of skin: Secondary | ICD-10-CM

## 2015-01-03 DIAGNOSIS — B351 Tinea unguium: Secondary | ICD-10-CM | POA: Diagnosis not present

## 2015-01-03 DIAGNOSIS — E1151 Type 2 diabetes mellitus with diabetic peripheral angiopathy without gangrene: Secondary | ICD-10-CM

## 2015-01-04 NOTE — Progress Notes (Signed)
He presents today to complaint of painful elongated nails. He will sister also take a look at the ulceration plantar aspect of the right foot.  Objective: Vital signs are stable he's alert and oriented 3 severe peripheral vascular disease with history of skin breakdown. Nails are thick yellow dystrophic, mycotic and painful palpation as well as debridement. Ulceration has gone to completely heal at this point wounds.  assessment: pain limb secondary to onychomycosis but healing ulceration.  plan: follow up with me in 2 months for nail debridement and reevaluation of ulceration.

## 2015-03-05 ENCOUNTER — Ambulatory Visit (INDEPENDENT_AMBULATORY_CARE_PROVIDER_SITE_OTHER): Payer: Medicare Other | Admitting: Podiatry

## 2015-03-05 ENCOUNTER — Encounter: Payer: Self-pay | Admitting: Podiatry

## 2015-03-05 DIAGNOSIS — L851 Acquired keratosis [keratoderma] palmaris et plantaris: Secondary | ICD-10-CM | POA: Diagnosis not present

## 2015-03-05 DIAGNOSIS — B351 Tinea unguium: Secondary | ICD-10-CM

## 2015-03-05 DIAGNOSIS — M79676 Pain in unspecified toe(s): Secondary | ICD-10-CM | POA: Diagnosis not present

## 2015-03-05 NOTE — Progress Notes (Signed)
He presents today with a chief complaint of painful elongated toenails with calluses sub-fifth metatarsophalangeal joints which are painful.  Objective: Vital signs are stable he is alert and oriented 3 pulses are moderately palpable reactive hyperkeratosis sub-fifth metatarsophalangeal joints bilateral. His nails are elongated and brittle. They're painful on palpation as well as debridement.  Assessment: Pain and limb secondary to onychomycosis 1 through 5 bilateral porokeratosis of fifth bilateral with no open lesions.  Plan: Debrided all reactive hyperkeratotic tissue and debrided nails 1 through 5 bilateral.

## 2015-04-13 ENCOUNTER — Other Ambulatory Visit: Payer: Self-pay | Admitting: Neurology

## 2015-04-13 DIAGNOSIS — R270 Ataxia, unspecified: Secondary | ICD-10-CM

## 2015-04-21 ENCOUNTER — Ambulatory Visit
Admission: RE | Admit: 2015-04-21 | Discharge: 2015-04-21 | Disposition: A | Discharge status: Home / Self Care | Payer: Medicare Other | Source: Ambulatory Visit | Attending: Neurology | Admitting: Neurology

## 2015-04-21 DIAGNOSIS — R270 Ataxia, unspecified: Secondary | ICD-10-CM | POA: Diagnosis present

## 2015-04-21 DIAGNOSIS — G319 Degenerative disease of nervous system, unspecified: Secondary | ICD-10-CM | POA: Diagnosis not present

## 2015-05-07 ENCOUNTER — Encounter: Payer: Self-pay | Admitting: Podiatry

## 2015-05-07 ENCOUNTER — Ambulatory Visit (INDEPENDENT_AMBULATORY_CARE_PROVIDER_SITE_OTHER): Payer: Medicare Other | Admitting: Podiatry

## 2015-05-07 DIAGNOSIS — M79676 Pain in unspecified toe(s): Secondary | ICD-10-CM | POA: Diagnosis not present

## 2015-05-07 DIAGNOSIS — Q828 Other specified congenital malformations of skin: Secondary | ICD-10-CM

## 2015-05-07 DIAGNOSIS — B351 Tinea unguium: Secondary | ICD-10-CM

## 2015-05-07 NOTE — Progress Notes (Signed)
He presents today with chief complaint of painful elongated toenails with corns and calluses plantar aspect fifth metatarsophalangeal joints bilateral.  Objective: Vital signs are stable he is alert and oriented 3 pulses remain barely palpable bilateral capillary fill time is somewhat sluggish. His nails are thick yellow dystrophic onychomycotic and painful on palpation. Reactive hyperkeratosis was supple metatarsal heads bilaterally.  Assessment: Pain in limb secondary to septic fifth met head lesions as well as pain in limb secondary to onychomycosis 1 through 5 bilateral.  Plan: Debrided all reactive hyperkeratotic lesions sub-fifth metatarsal heads bilateral. Debrided nails 1 through 5 bilateral card service secondary to pain.  Roselind Messier DPM

## 2015-05-14 ENCOUNTER — Ambulatory Visit: Payer: Medicare Other | Attending: Neurology | Admitting: Physical Therapy

## 2015-05-14 ENCOUNTER — Encounter: Payer: Self-pay | Admitting: Physical Therapy

## 2015-05-14 DIAGNOSIS — R262 Difficulty in walking, not elsewhere classified: Secondary | ICD-10-CM | POA: Insufficient documentation

## 2015-05-15 NOTE — Therapy (Signed)
[place] MAIN [facility] SERVICES [address] [place], [place], [address] Phone: [phone]   Fax:  [phone]  Physical Therapy Evaluation  Patient Details  Name: Troy Orr MRN: [id number] Date of Birth: Jun 02, 1922 Referring Provider: Vladimir Crofts  Encounter Date: 05/14/2015    Past Medical History  Diagnosis Date  . Heart trouble   . Arthritis   . Cancer ([place])   . Diabetes [facility])     Past Surgical History  Procedure Laterality Date  . Heart bypass      There were no vitals filed for this visit.  Visit Diagnosis:  Difficulty walking      Subjective Assessment - 05/14/15 1415    Subjective Patient reports to PT due to difficulty walking and decreased balance. Patient reports that he has been having problems with his balance for the last couple years, and states that he has needed his walker for all mobilization for at least a year. Patient also reports that he is currently exercising with PT at ALF.    Pertinent History heart valve replacement in 1995, Colon cancer treatment in 1985.     Limitations Standing;Walking;House hold activities   How long can you stand comfortably? 5 minutes with walking    How long can you walk comfortably? able to walk to dining room with RW>    Patient Stated Goals be able to pick object up off the floor. be able to walk without RW.    Currently in Pain? No/denies            [facility] PT Assessment - 05/15/15 0001    Assessment   Medical Diagnosis decreased balance   Referring Provider Hemang K Manuella Ghazi   Onset Date/Surgical Date 04/17/15   Hand Dominance Right   Next MD Visit unknown   Prior Therapy PT at ALF                                       Plan - 05/15/15 0708    Clinical Impression Statement Patient reports to PT due to increased balance problems. During history intake it was determined that patient is currently on PT caseload at ALF, and will  continue to receive PT under their care.    Pt will benefit from skilled therapeutic intervention in order to improve on the following deficits Abnormal gait;Decreased balance;Decreased activity tolerance;Decreased endurance;Decreased strength   PT Frequency One time visit   PT Next Visit Plan Pt will continue to receive PT treatment at ALF   Consulted and Agree with Plan of Care Patient         Problem List There are no active problems to display for this patient.  Barrie Folk SPT 05/15/2015   9:10 AM  This entire session was performed under direct supervision and direction of a licensed therapist . I have personally read, edited and approve of the note as written.  Hopkins,Margaret PT, DPT 05/15/2015, 9:10 AM  [place] MAIN [facility] SERVICES [address] [place], [place], [address] Phone: [phone]   Fax:  [phone]  Name: Troy Orr MRN: [id number] Date of Birth: 10-25-1921

## 2015-05-16 ENCOUNTER — Ambulatory Visit: Payer: Medicare Other | Admitting: Physical Therapy

## 2015-05-21 ENCOUNTER — Ambulatory Visit: Payer: Medicare Other | Admitting: Physical Therapy

## 2015-05-23 ENCOUNTER — Ambulatory Visit: Payer: Medicare Other | Admitting: Physical Therapy

## 2015-05-28 ENCOUNTER — Ambulatory Visit: Payer: Medicare Other | Admitting: Physical Therapy

## 2015-05-30 ENCOUNTER — Ambulatory Visit: Payer: Medicare Other | Admitting: Physical Therapy

## 2015-06-04 ENCOUNTER — Ambulatory Visit: Payer: Medicare Other | Admitting: Physical Therapy

## 2015-06-06 ENCOUNTER — Ambulatory Visit: Payer: Medicare Other | Admitting: Physical Therapy

## 2015-07-12 ENCOUNTER — Ambulatory Visit: Payer: Medicare Other

## 2015-07-16 ENCOUNTER — Ambulatory Visit: Payer: Medicare Other | Admitting: Podiatry

## 2015-07-16 ENCOUNTER — Ambulatory Visit: Payer: Medicare Other

## 2016-04-09 ENCOUNTER — Ambulatory Visit (INDEPENDENT_AMBULATORY_CARE_PROVIDER_SITE_OTHER): Payer: Medicare Other | Admitting: Podiatry

## 2016-04-09 ENCOUNTER — Inpatient Hospital Stay
Admission: EM | Admit: 2016-04-09 | Discharge: 2016-05-07 | DRG: 853 | Disposition: E | Payer: Medicare Other | Attending: Internal Medicine | Admitting: Internal Medicine

## 2016-04-09 ENCOUNTER — Encounter: Payer: Self-pay | Admitting: Intensive Care

## 2016-04-09 ENCOUNTER — Encounter: Payer: Self-pay | Admitting: Podiatry

## 2016-04-09 ENCOUNTER — Emergency Department: Payer: Medicare Other

## 2016-04-09 VITALS — Temp 98.2°F

## 2016-04-09 DIAGNOSIS — G92 Toxic encephalopathy: Secondary | ICD-10-CM | POA: Diagnosis not present

## 2016-04-09 DIAGNOSIS — N179 Acute kidney failure, unspecified: Secondary | ICD-10-CM | POA: Diagnosis present

## 2016-04-09 DIAGNOSIS — M869 Osteomyelitis, unspecified: Secondary | ICD-10-CM | POA: Diagnosis present

## 2016-04-09 DIAGNOSIS — Z515 Encounter for palliative care: Secondary | ICD-10-CM | POA: Diagnosis present

## 2016-04-09 DIAGNOSIS — L97519 Non-pressure chronic ulcer of other part of right foot with unspecified severity: Secondary | ICD-10-CM | POA: Diagnosis present

## 2016-04-09 DIAGNOSIS — L03115 Cellulitis of right lower limb: Secondary | ICD-10-CM | POA: Diagnosis present

## 2016-04-09 DIAGNOSIS — N19 Unspecified kidney failure: Secondary | ICD-10-CM

## 2016-04-09 DIAGNOSIS — I4891 Unspecified atrial fibrillation: Secondary | ICD-10-CM | POA: Diagnosis present

## 2016-04-09 DIAGNOSIS — E1169 Type 2 diabetes mellitus with other specified complication: Secondary | ICD-10-CM | POA: Diagnosis present

## 2016-04-09 DIAGNOSIS — Z951 Presence of aortocoronary bypass graft: Secondary | ICD-10-CM | POA: Diagnosis not present

## 2016-04-09 DIAGNOSIS — L97511 Non-pressure chronic ulcer of other part of right foot limited to breakdown of skin: Secondary | ICD-10-CM

## 2016-04-09 DIAGNOSIS — Z66 Do not resuscitate: Secondary | ICD-10-CM | POA: Diagnosis present

## 2016-04-09 DIAGNOSIS — Z7984 Long term (current) use of oral hypoglycemic drugs: Secondary | ICD-10-CM

## 2016-04-09 DIAGNOSIS — E11628 Type 2 diabetes mellitus with other skin complications: Secondary | ICD-10-CM | POA: Diagnosis present

## 2016-04-09 DIAGNOSIS — E1140 Type 2 diabetes mellitus with diabetic neuropathy, unspecified: Secondary | ICD-10-CM | POA: Diagnosis present

## 2016-04-09 DIAGNOSIS — Z85038 Personal history of other malignant neoplasm of large intestine: Secondary | ICD-10-CM | POA: Diagnosis not present

## 2016-04-09 DIAGNOSIS — D72829 Elevated white blood cell count, unspecified: Secondary | ICD-10-CM

## 2016-04-09 DIAGNOSIS — E1151 Type 2 diabetes mellitus with diabetic peripheral angiopathy without gangrene: Secondary | ICD-10-CM | POA: Diagnosis not present

## 2016-04-09 DIAGNOSIS — R0600 Dyspnea, unspecified: Secondary | ICD-10-CM

## 2016-04-09 DIAGNOSIS — E785 Hyperlipidemia, unspecified: Secondary | ICD-10-CM | POA: Diagnosis present

## 2016-04-09 DIAGNOSIS — Z7982 Long term (current) use of aspirin: Secondary | ICD-10-CM

## 2016-04-09 DIAGNOSIS — Z79899 Other long term (current) drug therapy: Secondary | ICD-10-CM

## 2016-04-09 DIAGNOSIS — T80212A Local infection due to central venous catheter, initial encounter: Secondary | ICD-10-CM

## 2016-04-09 DIAGNOSIS — E11621 Type 2 diabetes mellitus with foot ulcer: Secondary | ICD-10-CM | POA: Diagnosis present

## 2016-04-09 DIAGNOSIS — L97509 Non-pressure chronic ulcer of other part of unspecified foot with unspecified severity: Secondary | ICD-10-CM

## 2016-04-09 DIAGNOSIS — L8990 Pressure ulcer of unspecified site, unspecified stage: Secondary | ICD-10-CM | POA: Insufficient documentation

## 2016-04-09 DIAGNOSIS — E119 Type 2 diabetes mellitus without complications: Secondary | ICD-10-CM | POA: Diagnosis not present

## 2016-04-09 DIAGNOSIS — L089 Local infection of the skin and subcutaneous tissue, unspecified: Secondary | ICD-10-CM

## 2016-04-09 DIAGNOSIS — A419 Sepsis, unspecified organism: Secondary | ICD-10-CM | POA: Diagnosis present

## 2016-04-09 DIAGNOSIS — J9600 Acute respiratory failure, unspecified whether with hypoxia or hypercapnia: Secondary | ICD-10-CM

## 2016-04-09 DIAGNOSIS — M79605 Pain in left leg: Secondary | ICD-10-CM | POA: Diagnosis not present

## 2016-04-09 DIAGNOSIS — M86071 Acute hematogenous osteomyelitis, right ankle and foot: Secondary | ICD-10-CM | POA: Diagnosis not present

## 2016-04-09 DIAGNOSIS — M1990 Unspecified osteoarthritis, unspecified site: Secondary | ICD-10-CM | POA: Diagnosis present

## 2016-04-09 DIAGNOSIS — M009 Pyogenic arthritis, unspecified: Secondary | ICD-10-CM | POA: Diagnosis present

## 2016-04-09 DIAGNOSIS — E1152 Type 2 diabetes mellitus with diabetic peripheral angiopathy with gangrene: Secondary | ICD-10-CM | POA: Diagnosis present

## 2016-04-09 DIAGNOSIS — E1122 Type 2 diabetes mellitus with diabetic chronic kidney disease: Secondary | ICD-10-CM | POA: Diagnosis present

## 2016-04-09 DIAGNOSIS — N183 Chronic kidney disease, stage 3 (moderate): Secondary | ICD-10-CM | POA: Diagnosis present

## 2016-04-09 DIAGNOSIS — R570 Cardiogenic shock: Secondary | ICD-10-CM | POA: Diagnosis not present

## 2016-04-09 DIAGNOSIS — Z4659 Encounter for fitting and adjustment of other gastrointestinal appliance and device: Secondary | ICD-10-CM

## 2016-04-09 DIAGNOSIS — I129 Hypertensive chronic kidney disease with stage 1 through stage 4 chronic kidney disease, or unspecified chronic kidney disease: Secondary | ICD-10-CM | POA: Diagnosis present

## 2016-04-09 DIAGNOSIS — N289 Disorder of kidney and ureter, unspecified: Secondary | ICD-10-CM

## 2016-04-09 DIAGNOSIS — E872 Acidosis: Secondary | ICD-10-CM | POA: Diagnosis not present

## 2016-04-09 DIAGNOSIS — I70238 Atherosclerosis of native arteries of right leg with ulceration of other part of lower right leg: Secondary | ICD-10-CM | POA: Diagnosis not present

## 2016-04-09 DIAGNOSIS — I2510 Atherosclerotic heart disease of native coronary artery without angina pectoris: Secondary | ICD-10-CM | POA: Diagnosis present

## 2016-04-09 DIAGNOSIS — I97711 Intraoperative cardiac arrest during other surgery: Secondary | ICD-10-CM | POA: Diagnosis not present

## 2016-04-09 DIAGNOSIS — R509 Fever, unspecified: Secondary | ICD-10-CM | POA: Diagnosis not present

## 2016-04-09 DIAGNOSIS — J9601 Acute respiratory failure with hypoxia: Secondary | ICD-10-CM | POA: Diagnosis not present

## 2016-04-09 DIAGNOSIS — I469 Cardiac arrest, cause unspecified: Secondary | ICD-10-CM | POA: Diagnosis not present

## 2016-04-09 DIAGNOSIS — K279 Peptic ulcer, site unspecified, unspecified as acute or chronic, without hemorrhage or perforation: Secondary | ICD-10-CM | POA: Diagnosis present

## 2016-04-09 DIAGNOSIS — D649 Anemia, unspecified: Secondary | ICD-10-CM | POA: Diagnosis present

## 2016-04-09 LAB — CBC WITH DIFFERENTIAL/PLATELET
BASOS ABS: 0.1 10*3/uL (ref 0–0.1)
BASOS PCT: 1 %
EOS PCT: 0 %
Eosinophils Absolute: 0 10*3/uL (ref 0–0.7)
HCT: 39.4 % — ABNORMAL LOW (ref 40.0–52.0)
Hemoglobin: 13.2 g/dL (ref 13.0–18.0)
Lymphocytes Relative: 9 %
Lymphs Abs: 1.5 10*3/uL (ref 1.0–3.6)
MCH: 32.4 pg (ref 26.0–34.0)
MCHC: 33.5 g/dL (ref 32.0–36.0)
MCV: 96.8 fL (ref 80.0–100.0)
Monocytes Absolute: 1.2 10*3/uL — ABNORMAL HIGH (ref 0.2–1.0)
Monocytes Relative: 7 %
NEUTROS ABS: 12.9 10*3/uL — AB (ref 1.4–6.5)
Neutrophils Relative %: 83 %
PLATELETS: 164 10*3/uL (ref 150–440)
RBC: 4.07 MIL/uL — AB (ref 4.40–5.90)
RDW: 15.6 % — AB (ref 11.5–14.5)
WBC: 15.6 10*3/uL — AB (ref 3.8–10.6)

## 2016-04-09 LAB — COMPREHENSIVE METABOLIC PANEL
ALT: 20 U/L (ref 17–63)
ANION GAP: 11 (ref 5–15)
AST: 25 U/L (ref 15–41)
Albumin: 3.4 g/dL — ABNORMAL LOW (ref 3.5–5.0)
Alkaline Phosphatase: 79 U/L (ref 38–126)
BUN: 35 mg/dL — ABNORMAL HIGH (ref 6–20)
CHLORIDE: 102 mmol/L (ref 101–111)
CO2: 22 mmol/L (ref 22–32)
CREATININE: 1.41 mg/dL — AB (ref 0.61–1.24)
Calcium: 9 mg/dL (ref 8.9–10.3)
GFR, EST AFRICAN AMERICAN: 48 mL/min — AB (ref >60)
GFR, EST NON AFRICAN AMERICAN: 42 mL/min — AB (ref >60)
Glucose, Bld: 182 mg/dL — ABNORMAL HIGH (ref 65–99)
Potassium: 4.6 mmol/L (ref 3.5–5.1)
SODIUM: 135 mmol/L (ref 135–145)
Total Bilirubin: 0.4 mg/dL (ref 0.3–1.2)
Total Protein: 8.5 g/dL — ABNORMAL HIGH (ref 6.5–8.1)

## 2016-04-09 LAB — CBC
HEMATOCRIT: 37.1 % — AB (ref 40.0–52.0)
Hemoglobin: 12.2 g/dL — ABNORMAL LOW (ref 13.0–18.0)
MCH: 31.8 pg (ref 26.0–34.0)
MCHC: 33 g/dL (ref 32.0–36.0)
MCV: 96.4 fL (ref 80.0–100.0)
Platelets: 137 10*3/uL — ABNORMAL LOW (ref 150–440)
RBC: 3.84 MIL/uL — ABNORMAL LOW (ref 4.40–5.90)
RDW: 15.5 % — AB (ref 11.5–14.5)
WBC: 11.8 10*3/uL — AB (ref 3.8–10.6)

## 2016-04-09 LAB — GLUCOSE, CAPILLARY
GLUCOSE-CAPILLARY: 183 mg/dL — AB (ref 65–99)
Glucose-Capillary: 177 mg/dL — ABNORMAL HIGH (ref 65–99)

## 2016-04-09 LAB — MRSA PCR SCREENING: MRSA BY PCR: NEGATIVE

## 2016-04-09 LAB — LACTIC ACID, PLASMA
LACTIC ACID, VENOUS: 4 mmol/L — AB (ref 0.5–1.9)
LACTIC ACID, VENOUS: 2 mmol/L — AB (ref 0.5–1.9)
Lactic Acid, Venous: 1.7 mmol/L (ref 0.5–1.9)

## 2016-04-09 LAB — TSH: TSH: 0.497 u[IU]/mL (ref 0.350–4.500)

## 2016-04-09 LAB — CREATININE, SERUM
Creatinine, Ser: 1.18 mg/dL (ref 0.61–1.24)
GFR, EST AFRICAN AMERICAN: 60 mL/min — AB (ref >60)
GFR, EST NON AFRICAN AMERICAN: 52 mL/min — AB (ref >60)

## 2016-04-09 LAB — TROPONIN I: TROPONIN I: 0.06 ng/mL — AB (ref <0.03)

## 2016-04-09 MED ORDER — POLYETHYLENE GLYCOL 3350 17 G PO PACK
17.0000 g | PACK | Freq: Every day | ORAL | Status: DC
  Administered 2016-04-10 – 2016-04-11 (×2): 17 g via ORAL
  Filled 2016-04-09 (×2): qty 1

## 2016-04-09 MED ORDER — METOPROLOL TARTRATE 50 MG PO TABS
50.0000 mg | ORAL_TABLET | Freq: Two times a day (BID) | ORAL | Status: DC
  Administered 2016-04-09 – 2016-04-13 (×8): 50 mg via ORAL
  Filled 2016-04-09 (×8): qty 1

## 2016-04-09 MED ORDER — AMLODIPINE BESYLATE 5 MG PO TABS
5.0000 mg | ORAL_TABLET | Freq: Every day | ORAL | Status: DC
  Administered 2016-04-09 – 2016-04-11 (×3): 5 mg via ORAL
  Filled 2016-04-09 (×3): qty 1

## 2016-04-09 MED ORDER — INSULIN ASPART 100 UNIT/ML ~~LOC~~ SOLN
0.0000 [IU] | Freq: Three times a day (TID) | SUBCUTANEOUS | Status: DC
  Administered 2016-04-10: 2 [IU] via SUBCUTANEOUS
  Administered 2016-04-10 – 2016-04-11 (×2): 1 [IU] via SUBCUTANEOUS
  Administered 2016-04-11: 2 [IU] via SUBCUTANEOUS
  Administered 2016-04-12 – 2016-04-13 (×2): 1 [IU] via SUBCUTANEOUS
  Administered 2016-04-14: 2 [IU] via SUBCUTANEOUS
  Administered 2016-04-14 (×2): 1 [IU] via SUBCUTANEOUS
  Administered 2016-04-15: 3 [IU] via SUBCUTANEOUS
  Administered 2016-04-15: 2 [IU] via SUBCUTANEOUS
  Filled 2016-04-09 (×2): qty 1
  Filled 2016-04-09: qty 2
  Filled 2016-04-09: qty 1
  Filled 2016-04-09: qty 2
  Filled 2016-04-09 (×2): qty 1
  Filled 2016-04-09: qty 3
  Filled 2016-04-09: qty 1
  Filled 2016-04-09: qty 2

## 2016-04-09 MED ORDER — SODIUM CHLORIDE 0.9 % IV SOLN
INTRAVENOUS | Status: DC
  Administered 2016-04-09: 21:00:00 via INTRAVENOUS

## 2016-04-09 MED ORDER — ENOXAPARIN SODIUM 40 MG/0.4ML ~~LOC~~ SOLN
40.0000 mg | SUBCUTANEOUS | Status: DC
  Administered 2016-04-09 – 2016-04-11 (×3): 40 mg via SUBCUTANEOUS
  Filled 2016-04-09 (×4): qty 0.4

## 2016-04-09 MED ORDER — SIMVASTATIN 20 MG PO TABS
40.0000 mg | ORAL_TABLET | Freq: Every day | ORAL | Status: DC
  Administered 2016-04-09 – 2016-04-11 (×3): 40 mg via ORAL
  Filled 2016-04-09 (×3): qty 2

## 2016-04-09 MED ORDER — SODIUM CHLORIDE 0.9 % IV BOLUS (SEPSIS)
1000.0000 mL | Freq: Once | INTRAVENOUS | Status: AC
  Administered 2016-04-09: 1000 mL via INTRAVENOUS

## 2016-04-09 MED ORDER — ONDANSETRON HCL 4 MG/2ML IJ SOLN
4.0000 mg | Freq: Four times a day (QID) | INTRAMUSCULAR | Status: DC | PRN
Start: 2016-04-09 — End: 2016-04-15

## 2016-04-09 MED ORDER — VANCOMYCIN HCL IN DEXTROSE 1-5 GM/200ML-% IV SOLN
1000.0000 mg | Freq: Once | INTRAVENOUS | Status: AC
  Administered 2016-04-09: 1000 mg via INTRAVENOUS
  Filled 2016-04-09: qty 200

## 2016-04-09 MED ORDER — SODIUM CHLORIDE 0.9 % IV SOLN
INTRAVENOUS | Status: DC
  Administered 2016-04-09: 19:00:00 via INTRAVENOUS

## 2016-04-09 MED ORDER — ASPIRIN EC 81 MG PO TBEC
81.0000 mg | DELAYED_RELEASE_TABLET | Freq: Every day | ORAL | Status: DC
  Administered 2016-04-10 – 2016-04-11 (×2): 81 mg via ORAL
  Filled 2016-04-09 (×2): qty 1

## 2016-04-09 MED ORDER — LINAGLIPTIN 5 MG PO TABS
5.0000 mg | ORAL_TABLET | Freq: Every day | ORAL | Status: DC
  Administered 2016-04-09 – 2016-04-11 (×3): 5 mg via ORAL
  Filled 2016-04-09 (×2): qty 1

## 2016-04-09 MED ORDER — VANCOMYCIN HCL IN DEXTROSE 1-5 GM/200ML-% IV SOLN
1000.0000 mg | INTRAVENOUS | Status: DC
  Administered 2016-04-09 – 2016-04-11 (×3): 1000 mg via INTRAVENOUS
  Filled 2016-04-09 (×4): qty 200

## 2016-04-09 MED ORDER — PIPERACILLIN-TAZOBACTAM 3.375 G IVPB
3.3750 g | Freq: Three times a day (TID) | INTRAVENOUS | Status: DC
Start: 2016-04-09 — End: 2016-04-14
  Administered 2016-04-09 – 2016-04-14 (×15): 3.375 g via INTRAVENOUS
  Filled 2016-04-09 (×15): qty 50

## 2016-04-09 MED ORDER — SIMETHICONE 80 MG PO CHEW
80.0000 mg | CHEWABLE_TABLET | Freq: Four times a day (QID) | ORAL | Status: DC
  Administered 2016-04-09 – 2016-04-12 (×12): 80 mg via ORAL
  Filled 2016-04-09 (×12): qty 1

## 2016-04-09 MED ORDER — DOCUSATE SODIUM 100 MG PO CAPS
100.0000 mg | ORAL_CAPSULE | Freq: Two times a day (BID) | ORAL | Status: DC
  Administered 2016-04-09 – 2016-04-12 (×6): 100 mg via ORAL
  Filled 2016-04-09 (×6): qty 1

## 2016-04-09 MED ORDER — ONDANSETRON HCL 4 MG PO TABS
4.0000 mg | ORAL_TABLET | Freq: Four times a day (QID) | ORAL | Status: DC | PRN
  Administered 2016-04-12: 4 mg via ORAL
  Filled 2016-04-09: qty 1

## 2016-04-09 MED ORDER — INSULIN ASPART 100 UNIT/ML ~~LOC~~ SOLN
0.0000 [IU] | Freq: Every day | SUBCUTANEOUS | Status: DC
  Filled 2016-04-09: qty 4
  Filled 2016-04-09: qty 2

## 2016-04-09 MED ORDER — ACETAMINOPHEN 325 MG PO TABS: 650.0000 mg | ORAL_TABLET | Freq: Four times a day (QID) | ORAL | Status: DC | PRN

## 2016-04-09 MED ORDER — HYDROCODONE-ACETAMINOPHEN 5-325 MG PO TABS
1.0000 | ORAL_TABLET | ORAL | Status: DC | PRN
  Administered 2016-04-09 – 2016-04-10 (×2): 2 via ORAL
  Administered 2016-04-11 (×2): 1 via ORAL
  Filled 2016-04-09: qty 1
  Filled 2016-04-09 (×2): qty 2
  Filled 2016-04-09: qty 1

## 2016-04-09 MED ORDER — SODIUM CHLORIDE 0.9% FLUSH
3.0000 mL | Freq: Two times a day (BID) | INTRAVENOUS | Status: DC
  Administered 2016-04-10 – 2016-04-13 (×4): 3 mL via INTRAVENOUS

## 2016-04-09 MED ORDER — INSULIN ASPART 100 UNIT/ML ~~LOC~~ SOLN
4.0000 [IU] | Freq: Three times a day (TID) | SUBCUTANEOUS | Status: DC
  Administered 2016-04-10 – 2016-04-12 (×5): 4 [IU] via SUBCUTANEOUS
  Filled 2016-04-09 (×4): qty 4

## 2016-04-09 MED ORDER — SODIUM CHLORIDE 0.9 % IV BOLUS (SEPSIS)
250.0000 mL | Freq: Once | INTRAVENOUS | Status: AC
  Administered 2016-04-09: 250 mL via INTRAVENOUS

## 2016-04-09 MED ORDER — PIPERACILLIN-TAZOBACTAM 3.375 G IVPB 30 MIN
3.3750 g | Freq: Once | INTRAVENOUS | Status: AC
  Administered 2016-04-09: 3.375 g via INTRAVENOUS
  Filled 2016-04-09: qty 50

## 2016-04-09 MED ORDER — SENNA 8.6 MG PO TABS
1.0000 | ORAL_TABLET | Freq: Every day | ORAL | Status: DC
  Administered 2016-04-09 – 2016-04-11 (×3): 8.6 mg via ORAL
  Filled 2016-04-09 (×3): qty 1

## 2016-04-09 MED ORDER — PANTOPRAZOLE SODIUM 40 MG PO TBEC
40.0000 mg | DELAYED_RELEASE_TABLET | Freq: Every day | ORAL | Status: DC
  Administered 2016-04-10 – 2016-04-11 (×2): 40 mg via ORAL
  Filled 2016-04-09 (×2): qty 1

## 2016-04-09 MED ORDER — CALCIUM POLYCARBOPHIL 625 MG PO TABS
625.0000 mg | ORAL_TABLET | Freq: Two times a day (BID) | ORAL | Status: DC
  Administered 2016-04-09 – 2016-04-12 (×6): 625 mg via ORAL
  Filled 2016-04-09 (×7): qty 1

## 2016-04-09 MED ORDER — TRAZODONE HCL 50 MG PO TABS
50.0000 mg | ORAL_TABLET | Freq: Every day | ORAL | Status: DC
  Administered 2016-04-09 – 2016-04-12 (×4): 50 mg via ORAL
  Filled 2016-04-09 (×4): qty 1

## 2016-04-09 MED ORDER — VITAMIN B-12 1000 MCG PO TABS
1000.0000 ug | ORAL_TABLET | Freq: Every day | ORAL | Status: DC
  Administered 2016-04-09 – 2016-04-11 (×3): 1000 ug via ORAL
  Filled 2016-04-09 (×3): qty 1

## 2016-04-09 MED ORDER — MECLIZINE HCL 25 MG PO TABS
25.0000 mg | ORAL_TABLET | Freq: Three times a day (TID) | ORAL | Status: DC | PRN
  Filled 2016-04-09: qty 1

## 2016-04-09 MED ORDER — ACETAMINOPHEN 650 MG RE SUPP: 650.0000 mg | Freq: Four times a day (QID) | RECTAL | Status: DC | PRN

## 2016-04-09 NOTE — ED Provider Notes (Signed)
[facility] Emergency Department Provider Note  ____________________________________________  Time seen: Approximately 5:44 PM  I have reviewed the triage vital signs and the nursing notes.   HISTORY  Chief Complaint Foot Pain (Right)  Level 5 caveat:  Portions of the history and physical were unable to be obtained due to the patient's poor historian   HPI Troy Orr is a 82 y.o. male sent to the ED for evaluation of foot wound with drainage. Patient states that he feels fine and has been eating and drinking normally. Denies any complaints.     Past Medical History:  Diagnosis Date  . Arthritis   . Cancer [facility])    Colon   . Diabetes ([place])   . Heart trouble      There are no active problems to display for this patient.    Past Surgical History:  Procedure Laterality Date  . heart bypass       Prior to Admission medications   Medication Sig Start Date End Date Taking? Authorizing Provider  aspirin 81 MG tablet Take 81 mg by mouth daily.   Yes Historical Provider, MD  metoprolol succinate (TOPROL-XL) 50 MG 24 hr tablet  11/16/13  Yes Historical Provider, MD  Multiple Vitamin (DAILY VITE) TABS Take 1 tablet by mouth daily.   Yes Historical Provider, MD  omeprazole (PRILOSEC) 20 MG capsule Take 20 mg by mouth daily.   Yes Historical Provider, MD  torsemide (DEMADEX) 20 MG tablet Take 20 mg by mouth once.  11/30/13  Yes Historical Provider, MD  vitamin B-12 (CYANOCOBALAMIN) 1000 MCG tablet Take 1,000 mcg by mouth daily.   Yes Historical Provider, MD  amLODipine (NORVASC) 5 MG tablet Take 5 mg by mouth daily.    Historical Provider, MD  diphenoxylate-atropine (LOMOTIL) 2.5-0.025 MG per tablet  10/28/13   Historical Provider, MD  econazole nitrate 1 % cream  11/30/13   Historical Provider, MD  glimepiride (AMARYL) 2 MG tablet  11/21/13   Historical Provider, MD  hydrOXYzine (ATARAX/VISTARIL) 25 MG tablet  09/14/13   Historical Provider, MD   meclizine (ANTIVERT) 25 MG tablet Take 25 mg by mouth 3 (three) times daily as needed for dizziness.    Historical Provider, MD  metFORMIN (GLUCOPHAGE) 1000 MG tablet Take 1,000 mg by mouth 2 (two) times daily with a meal.    Historical Provider, MD  metoprolol (LOPRESSOR) 50 MG tablet Take 50 mg by mouth 2 (two) times daily.    Historical Provider, MD  mupirocin ointment (BACTROBAN) 2 % Apply to wound twice a day. 10/23/14   Max T Hyatt, DPM  polyethylene glycol (MIRALAX / GLYCOLAX) packet Take 17 g by mouth daily.    Historical Provider, MD  ramipril (ALTACE) 1.25 MG capsule Take 1.25 mg by mouth daily.    Historical Provider, MD  simvastatin (ZOCOR) 40 MG tablet Take 40 mg by mouth daily.    Historical Provider, MD  sitaGLIPtin (JANUVIA) 50 MG tablet Take 50 mg by mouth daily.    Historical Provider, MD     Allergies Review of patient's allergies indicates no known allergies.   History reviewed. No pertinent family history.  Social History Social History  Substance Use Topics  . Smoking status: Never Smoker  . Smokeless tobacco: Never Used  . Alcohol use No    Review of Systems Unable to reliably obtain ____________________________________________   PHYSICAL EXAM:  VITAL SIGNS: ED Triage Vitals  Enc Vitals Group     BP 04/16/2016 1618 131/79  Pulse Rate 04/20/2016 1618 (!) 117     Resp 04/12/2016 1618 20     Temp 04/30/2016 1618 98.2 F (36.8 C)     Temp Source 04/11/2016 1618 Oral     SpO2 04/23/2016 1618 97 %     Weight 04/08/2016 1619 161 lb (73 kg)     Height 04/16/2016 1619 5\' 9"  (1.753 m)     Head Circumference --      Peak Flow --      Pain Score --      Pain Loc --      Pain Edu? --      Excl. in [place]? --     Vital signs reviewed, nursing assessments reviewed.   Constitutional:   Alert and orientedTo person and place. Ill appearing. Eyes:   No scleral icterus. No conjunctival pallor. PERRL. EOMI.  No nystagmus. ENT   Head:   Normocephalic and atraumatic.    Nose:   No congestion/rhinnorhea. No septal hematoma   Mouth/Throat:   Dry mucous membranes, no pharyngeal erythema. No peritonsillar mass.    Neck:   No stridor. No SubQ emphysema. No meningismus. Hematological/Lymphatic/Immunilogical:   No cervical lymphadenopathy. Cardiovascular:   Tachycardia heart rate 120. Symmetric bilateral radial and DP pulses.  No murmurs.  Respiratory:   Normal respiratory effort without tachypnea nor retractions. Breath sounds are clear and equal bilaterally. No wheezes/rales/rhonchi. Gastrointestinal:   Soft and nontender. Non distended. There is no CVA tenderness.  No rebound, rigidity, or guarding. Genitourinary:   deferred Musculoskeletal:   Right foot and ankle are erythematous and swollen. There are 2 wounds on the plantar and lateral aspects of the fifth MTP joint on the right foot with purulent drainage and necrotic tissue. No crepitus. No calf tenderness or calf swelling. Neurologic:   Normal speech and language.  CN 2-10 normal. Motor grossly intact. No gross focal neurologic deficits are appreciated.  Skin:    Skin is warm, dry with wounds and erythema as above.  ____________________________________________    LABS (pertinent positives/negatives) (all labs ordered are listed, but only abnormal results are displayed) Labs Reviewed  COMPREHENSIVE METABOLIC PANEL - Abnormal; Notable for the following:       Result Value   Glucose, Bld 182 (*)    BUN 35 (*)    Creatinine, Ser 1.41 (*)    Total Protein 8.5 (*)    Albumin 3.4 (*)    GFR calc non Af Amer 42 (*)    GFR calc Af Amer 48 (*)    All other components within normal limits  LACTIC ACID, PLASMA - Abnormal; Notable for the following:    Lactic Acid, Venous 4.0 (*)    All other components within normal limits  CBC WITH DIFFERENTIAL/PLATELET - Abnormal; Notable for the following:    WBC 15.6 (*)    RBC 4.07 (*)    HCT 39.4 (*)    RDW 15.6 (*)    Neutro Abs 12.9 (*)    Monocytes  Absolute 1.2 (*)    All other components within normal limits  GLUCOSE, CAPILLARY - Abnormal; Notable for the following:    Glucose-Capillary 177 (*)    All other components within normal limits  CULTURE, BLOOD (ROUTINE X 2)  CULTURE, BLOOD (ROUTINE X 2)  URINE CULTURE  AEROBIC CULTURE (SUPERFICIAL SPECIMEN)  LACTIC ACID, PLASMA  URINALYSIS COMPLETEWITH MICROSCOPIC ([facility] ONLY)   ____________________________________________   EKG  Interpreted by me Sinus tachycardia rate 1:15, left axis, right bundle branch block.  Anterior T-wave inversions consistent with repolarization abnormality from bundle branch block.  ____________________________________________    RADIOLOGY  Chest x-ray unremarkable X-ray right foot shows soft tissue defect without obvious signs of osteomyelitis.  ____________________________________________   PROCEDURES Procedures CRITICAL CARE Performed by: Joni Fears, Glenard Keesling   Total critical care time: 35 minutes  Critical care time was exclusive of separately billable procedures and treating other patients.  Critical care was necessary to treat or prevent imminent or life-threatening deterioration.  Critical care was time spent personally by me on the following activities: development of treatment plan with patient and/or surrogate as well as nursing, discussions with consultants, evaluation of patient's response to treatment, examination of patient, obtaining history from patient or surrogate, ordering and performing treatments and interventions, ordering and review of laboratory studies, ordering and review of radiographic studies, pulse oximetry and re-evaluation of patient's condition.  ____________________________________________   INITIAL IMPRESSION / ASSESSMENT AND PLAN / ED COURSE  Pertinent labs & imaging results that were available during my care of the patient were reviewed by me and considered in my medical decision making (see chart for  details).  Patient presents with tachycardia and foot infection the setting of immunocompromised with diabetes. The wounds appear somewhat gangrenous. Wound culture collected. Start aggressive IV fluid boluses, vancomycin, Zosyn. Sepsis protocol. Admit to hospital for further management.     Clinical Course    ----------------------------------------- 5:47 PM on 04/08/2016 -----------------------------------------  Lactate markedly elevated at 4. Continue IV fluids. Blood pressure is stable. ____________________________________________   FINAL CLINICAL IMPRESSION(S) / ED DIAGNOSES  Final diagnoses:  Diabetic foot infection ([place])  Sepsis, due to unspecified organism [facility])  Cellulitis of right lower extremity       Portions of this note were generated with dragon dictation software. Dictation errors may occur despite best attempts at proofreading.    Carrie Mew, MD 04/23/2016 [phone]

## 2016-04-09 NOTE — ED Notes (Signed)
Did EKG on pt

## 2016-04-09 NOTE — Progress Notes (Signed)
Pts. First troponin is 0.06. Dr. Jannifer Franklin notified and no new orders at this time

## 2016-04-09 NOTE — H&P (Signed)
[place] at [place] NAME: Troy Orr    MR#:  [id number]  DATE OF BIRTH:  06-Sep-1921  DATE OF ADMISSION:  04/08/2016  PRIMARY CARE PHYSICIAN: SPARKS,JEFFREY D, MD   REQUESTING/REFERRING PHYSICIAN:   CHIEF COMPLAINT:   Chief Complaint  Patient presents with  . Foot Pain    Right    HISTORY OF PRESENT ILLNESS: Troy Orr  is a 82 y.o. male with a known history of Arthritis, colon cancer, diabetes mellitus, coronary artery disease, status post cardiac bypass, who presents to the hospital with complaints of right lower extremity redness for the past one or 2 days, noted by skilled nursing facility just a. The patient denies any pain, admits of right lower extremity swelling. He feels otherwise quite good.   PAST MEDICAL HISTORY:   Past Medical History:  Diagnosis Date  . Arthritis   . Cancer [facility])    Colon   . Diabetes ([place])   . Heart trouble     PAST SURGICAL HISTORY: Past Surgical History:  Procedure Laterality Date  . heart bypass      SOCIAL HISTORY:  Social History  Substance Use Topics  . Smoking status: Never Smoker  . Smokeless tobacco: Never Used  . Alcohol use No    FAMILY HISTORY: History reviewed. No pertinent family history.  DRUG ALLERGIES: No Known Allergies  Review of Systems  Constitutional: Negative for chills, fever and weight loss.  HENT: Negative for congestion.   Eyes: Negative for blurred vision and double vision.  Respiratory: Negative for cough, sputum production, shortness of breath and wheezing.   Cardiovascular: Negative for chest pain, palpitations, orthopnea, leg swelling and PND.  Gastrointestinal: Negative for abdominal pain, blood in stool, constipation, diarrhea, nausea and vomiting.  Genitourinary: Negative for dysuria, frequency, hematuria and urgency.  Musculoskeletal: Negative for falls.  Skin: Positive for rash.  Neurological: Negative for dizziness, tremors,  focal weakness and headaches.  Endo/Heme/Allergies: Does not bruise/bleed easily.  Psychiatric/Behavioral: Negative for depression. The patient does not have insomnia.     MEDICATIONS AT HOME:  Prior to Admission medications   Medication Sig Start Date End Date Taking? Authorizing Provider  aspirin 81 MG tablet Take 81 mg by mouth daily.   Yes Historical Provider, MD  metFORMIN (GLUCOPHAGE) 1000 MG tablet Take 500 mg by mouth 3 (three) times daily.    Yes Historical Provider, MD  metoprolol succinate (TOPROL-XL) 50 MG 24 hr tablet  11/16/13  Yes Historical Provider, MD  Multiple Vitamin (DAILY VITE) TABS Take 1 tablet by mouth daily.   Yes Historical Provider, MD  omeprazole (PRILOSEC) 20 MG capsule Take 20 mg by mouth daily.   Yes Historical Provider, MD  polycarbophil (FIBERCON) 625 MG tablet Take 625 mg by mouth 2 (two) times daily.   Yes Historical Provider, MD  polyethylene glycol (MIRALAX / GLYCOLAX) packet Take 17 g by mouth daily.   Yes Historical Provider, MD  simethicone (MYLICON) 0000000 MG chewable tablet Chew 125 mg by mouth 4 (four) times daily -  before meals and at bedtime.   Yes Historical Provider, MD  torsemide (DEMADEX) 20 MG tablet Take 20 mg by mouth once.  11/30/13  Yes Historical Provider, MD  traZODone (DESYREL) 50 MG tablet Take 50 mg by mouth at bedtime.   Yes Historical Provider, MD  vitamin B-12 (CYANOCOBALAMIN) 1000 MCG tablet Take 1,000 mcg by mouth daily.   Yes Historical Provider, MD  amLODipine (NORVASC) 5 MG tablet Take  5 mg by mouth daily.    Historical Provider, MD  diphenoxylate-atropine (LOMOTIL) 2.5-0.025 MG per tablet  10/28/13   Historical Provider, MD  econazole nitrate 1 % cream  11/30/13   Historical Provider, MD  glimepiride (AMARYL) 2 MG tablet  11/21/13   Historical Provider, MD  hydrOXYzine (ATARAX/VISTARIL) 25 MG tablet  09/14/13   Historical Provider, MD  meclizine (ANTIVERT) 25 MG tablet Take 25 mg by mouth 3 (three) times daily as needed for  dizziness.    Historical Provider, MD  metoprolol (LOPRESSOR) 50 MG tablet Take 50 mg by mouth 2 (two) times daily.    Historical Provider, MD  mupirocin ointment (BACTROBAN) 2 % Apply to wound twice a day. 10/23/14   Max T Hyatt, DPM  ramipril (ALTACE) 1.25 MG capsule Take 1.25 mg by mouth daily.    Historical Provider, MD  simvastatin (ZOCOR) 40 MG tablet Take 40 mg by mouth daily.    Historical Provider, MD  sitaGLIPtin (JANUVIA) 50 MG tablet Take 50 mg by mouth daily.    Historical Provider, MD      PHYSICAL EXAMINATION:   VITAL SIGNS: Blood pressure 130/76, pulse (!) 111, temperature 98.2 F (36.8 C), temperature source Oral, resp. rate 20, height 5\' 9"  (1.753 m), weight 73 kg (161 lb), SpO2 97 %.  GENERAL:  82 y.o.-year-old patient lying in the bed with no acute distress.  EYES: Pupils equal, round, reactive to light and accommodation. No scleral icterus. Extraocular muscles intact.  HEENT: Head atraumatic, normocephalic. Oropharynx and nasopharynx clear.  NECK:  Supple, no jugular venous distention. No thyroid enlargement, no tenderness.  LUNGS: Normal breath sounds bilaterally, no wheezing, rales,rhonchi or crepitation. No use of accessory muscles of respiration.  CARDIOVASCULAR: S1, S2 normal. No murmurs, rubs, or gallops.  ABDOMEN: Soft, nontender, nondistended. Bowel sounds present. No organomegaly or mass.  EXTREMITIES: Right lower extremity and pedal edema of approximately 1-2+, some Calf tenderness on palpation on the right, no cyanosis, or clubbing. Right fifth toe base has few ulcers, lateral and plantar surface, blackish wound base on plantar surface ulcer, minimal purulence on the lateral ulceration but no drainage, erythema extending to 2 lower thirds of the tibia, increased warmth, but no significant tenderness on palpation of the foot itself, tender calf muscles on the right significant skin scaling  NEUROLOGIC: Cranial nerves II through XII are intact. Muscle strength 5/5  in all extremities. Sensation diminished to light touch in distal lower extremities . Gait not checked.  PSYCHIATRIC: The patient is alert and oriented x 3.  SKIN: Right foot two ulcerations were noted at fifth toe base.   LABORATORY PANEL:   CBC  Recent Labs Lab 04/19/2016 1622  WBC 15.6*  HGB 13.2  HCT 39.4*  PLT 164  MCV 96.8  MCH 32.4  MCHC 33.5  RDW 15.6*  LYMPHSABS 1.5  MONOABS 1.2*  EOSABS 0.0  BASOSABS 0.1   ------------------------------------------------------------------------------------------------------------------  Chemistries   Recent Labs Lab 04/21/2016 1622  NA 135  K 4.6  CL 102  CO2 22  GLUCOSE 182*  BUN 35*  CREATININE 1.41*  CALCIUM 9.0  AST 25  ALT 20  ALKPHOS 79  BILITOT 0.4   ------------------------------------------------------------------------------------------------------------------  Cardiac Enzymes No results for input(s): TROPONINI in the last 168 hours. ------------------------------------------------------------------------------------------------------------------  RADIOLOGY: Dg Chest Port 1 View  Result Date: 04/07/2016 CLINICAL DATA:  Foot ulcer and pain, tachycardia EXAM: PORTABLE CHEST 1 VIEW COMPARISON:  01/19/2013 FINDINGS: Cardiac shadow is enlarged. Postsurgical changes are again seen. The  lungs are well aerated bilaterally without focal infiltrate or sizable effusion. Postsurgical changes in the cervical spine are noted. No acute bony abnormality is seen. IMPRESSION: No active disease. Electronically Signed   By: Inez Catalina M.D.   On: 04/27/2016 17:37   Dg Foot Complete Right  Result Date: 04/14/2016 CLINICAL DATA:  Foot wound laterally with drainage, possible osteomyelitis EXAM: RIGHT FOOT COMPLETE - 3+ VIEW COMPARISON:  None. FINDINGS: Hallux valgus deformity is noted with degenerative changes of the first MTP joint. An ulcer is noted adjacent to the head of the fifth metatarsal although no bony erosion to suggest  osteomyelitis is seen. No acute fracture or dislocation is seen. Vascular calcifications are noted. IMPRESSION: Chronic changes without acute abnormality. No findings to suggest osteomyelitis. Electronically Signed   By: Inez Catalina M.D.   On: 04/08/2016 17:38    EKG: Orders placed or performed during the hospital encounter of 05/04/2016  . EKG 12-Lead  . EKG 12-Lead  EKG in the emergency room reveals sinus ECTOPIC atrial tachycardia, right bundle branch block, left surface of the block, old inferior infarct, nonspecific ST-T changes  IMPRESSION AND PLAN:  Active Problems:   Sepsis ([place])   Osteomyelitis of right foot (HCC)   Acute renal insufficiency   Leukocytosis #1. Sepsis due to right foot osteomyelitis/cellulitis, admit patient to medical floor, blood cultures are taken, patient is initiated on broad-spectrum antibiotic therapy with Zosyn and vancomycin, will continue the same, surface cultures will be taken, patient will be evaluated by podiatrist tomorrow for possible procedure #2. Osteomyelitis/cellulitis of right, continue broad-spectrum antibody therapy, get an MRSA PCR #3. Acute renal insufficiency, continue IV fluids, follow creatinine in the morning #4. Leukocytosis, follow with antibiotic therapy #5 Lactic acidosis, follow lactic acid level closely. Continue IV fluids, supportive therapy.  #6, sinus tachycardia, due to lactic acidosis, get TSH, follow cardiac enzymes 3, continue metoprolol All the records are reviewed and case discussed with ED provider. Management plans discussed with the patient, family and they are in agreement.  CODE STATUS: Code Status History    This patient does not have a recorded code status. Please follow your organizational policy for patients in this situation.       TOTAL Critical care TIME TAKING CARE OF THIS PATIENT: 60 minutes.    Theodoro Grist M.D on 04/07/2016 at 6:46 PM  Between 7am to 6pm - Pager - [phone] After 6pm go to  [email or web address] - password [email or web address] [place]  Office  [phone]  CC: Primary care physician; Idelle Crouch, MD

## 2016-04-09 NOTE — Progress Notes (Signed)
Dr. Jannifer Franklin notified of lactic acid level. NS changed to 100 hr.

## 2016-04-09 NOTE — ED Notes (Signed)
Called Doug in [place] initiated Code Sepsis at [facility]

## 2016-04-09 NOTE — ED Triage Notes (Addendum)
Pt arrived by staff from [facility]. Pt has a pressure ulcer on the R side of his R foot. Yellow drainage noted. Pt reports him and staff just became aware of wound today. Patient A&O X4, no confusion noted. Denies any other symptoms/ problems. Denies pain at this time

## 2016-04-09 NOTE — Progress Notes (Signed)
Pharmacy Antibiotic Note  ISABELLE HEDDING is a 82 y.o. male admitted on 04/21/2016 with Sepsis due to right foot osteomyelitis/cellulitis.  Pharmacy has been consulted for Vancomycin and Zosyn dosing. Patient received Vancomycin 1gm IV x1 and Zosyn 3.375 Iv x1 in ED.   Plan: Ke: 0.032   T1/2: 21.7   Vd: 48  Will start the patient on Vancomycin 1gm IV every 24 hours. Calculated trough at Css 18.8. Plan for trough prior to 5th dose. Pharmacy will continue to monitor renal function and adjust dose as needed.   Will start the patient on Zosyn 3.375 IV every 8 hours.   Height: 5\' 9"  (175.3 cm) Weight: 161 lb (73 kg) IBW/kg (Calculated) : 70.7  Temp (24hrs), Avg:98.2 F (36.8 C), Min:98.2 F (36.8 C), Max:98.2 F (36.8 C)   Recent Labs Lab 05/06/2016 1622  WBC 15.6*  CREATININE 1.41*  LATICACIDVEN 4.0*    Estimated Creatinine Clearance: 33.4 mL/min (by C-G formula based on SCr of 1.41 mg/dL (H)).    No Known Allergies  Antimicrobials this admission: 10/4 Zosyn >>  10/4 Vancomycin >>   Dose adjustments this admission:  Microbiology results: 10/4 BCx: pending  10/4 Wound Cx: pending  10/4 UCx: pending 10/4 MRSA PCR:   Thank you for allowing pharmacy to be a part of this patient's care.  Nancy Fetter, PharmD Clinical Pharmacist 04/13/2016 7:29 PM

## 2016-04-09 NOTE — Progress Notes (Signed)
Troy Orr presents today wheelchair bound with a 3 day duration with open wound to the lateral aspect of his fifth metatarsal right foot. He states that the foot doesn't hurt at all but he noticed a foul odor. He has a history of severe peripheral vascular disease. He denies trauma to the right foot.  Objective: Vital signs are stable he is alert and oriented 3. Pulses are nonpalpable right foot. He has cellulitis extending above the level of the ankle from the fifth metatarsal this area is barely warm to touch because of his cold cyanotic right foot and severe ischemia. The lateral aspect of the fifth metatarsal does demonstrate hypertrophy of the soft tissue which appears to have been present for more than 3 days. There is foul odor with purulence and drainage.  Assessment: Ischemic ulceration and ischemic leg and right foot. Cellulitis right foot.  Plan: I redressed the foot today and sent him to the emergency department stat. I offered him transported by ambulance but he declined. He had a driver from his extended care facility that was able to take him. I expect he will lose his right leg below the knee without revascularization.

## 2016-04-10 DIAGNOSIS — L8990 Pressure ulcer of unspecified site, unspecified stage: Secondary | ICD-10-CM | POA: Insufficient documentation

## 2016-04-10 LAB — CBC
HCT: 35.7 % — ABNORMAL LOW (ref 40.0–52.0)
Hemoglobin: 12 g/dL — ABNORMAL LOW (ref 13.0–18.0)
MCH: 32.7 pg (ref 26.0–34.0)
MCHC: 33.7 g/dL (ref 32.0–36.0)
MCV: 96.8 fL (ref 80.0–100.0)
PLATELETS: 134 10*3/uL — AB (ref 150–440)
RBC: 3.69 MIL/uL — ABNORMAL LOW (ref 4.40–5.90)
RDW: 15.6 % — AB (ref 11.5–14.5)
WBC: 9.4 10*3/uL (ref 3.8–10.6)

## 2016-04-10 LAB — BASIC METABOLIC PANEL
Anion gap: 6 (ref 5–15)
BUN: 32 mg/dL — AB (ref 6–20)
CHLORIDE: 109 mmol/L (ref 101–111)
CO2: 23 mmol/L (ref 22–32)
CREATININE: 1.2 mg/dL (ref 0.61–1.24)
Calcium: 8 mg/dL — ABNORMAL LOW (ref 8.9–10.3)
GFR calc Af Amer: 59 mL/min — ABNORMAL LOW (ref >60)
GFR calc non Af Amer: 51 mL/min — ABNORMAL LOW (ref >60)
Glucose, Bld: 191 mg/dL — ABNORMAL HIGH (ref 65–99)
Potassium: 4 mmol/L (ref 3.5–5.1)
Sodium: 138 mmol/L (ref 135–145)

## 2016-04-10 LAB — GLUCOSE, CAPILLARY
GLUCOSE-CAPILLARY: 166 mg/dL — AB (ref 65–99)
GLUCOSE-CAPILLARY: 149 mg/dL — AB (ref 65–99)
Glucose-Capillary: 113 mg/dL — ABNORMAL HIGH (ref 65–99)
Glucose-Capillary: 90 mg/dL (ref 65–99)

## 2016-04-10 LAB — TROPONIN I
TROPONIN I: 0.15 ng/mL — AB (ref <0.03)
Troponin I: 0.2 ng/mL (ref <0.03)

## 2016-04-10 NOTE — Clinical Social Work Note (Signed)
Clinical Social Work Assessment  Patient Details  Name: Troy Orr MRN: [id number] Date of Birth: 1922/05/12  Date of referral:  04/10/16               Reason for consult:  Discharge Planning, Facility Placement                Permission sought to share information with:  Facility Art therapist granted to share information::  Yes, Verbal Permission Granted  Name::      [facility]   Agency::     Relationship::     Contact Information:     Housing/Transportation Living arrangements for the past 2 months:  [place] of Information:  Patient Patient Interpreter Needed:  None Criminal Activity/Legal Involvement Pertinent to Current Situation/Hospitalization:    Significant Relationships:  None Lives with:  Self Do you feel safe going back to the place where you live?  Yes Need for family participation in patient care:  Yes (Comment)  Care giving concerns: Patient is a long term care resident at [facility] (fax: [phone]).    Social Worker assessment / plan:  Social work Theatre manager reviewed chart and noted that patient is from [facility]. Clinical Education officer, museum (CSW) Mel Almond called [facility]. Per Horris Latino patient has been a resident at [facility] since 08/28/2012. Horris Latino reported that he is a private pay resident, walks with a walker at baseline and is on room air. Per Horris Latino patient goes to the doctor for his foot often. Per Horris Latino patient's wife was also a resident at [facility] before she passed away several years ago. Per Horris Latino patient can return to [facility] if he is at his baseline. PT is pending. Social work Theatre manager met with patient to discuss D/C plan. Social work Theatre manager explained role of social work. Per patient, he has lived at [facility] for the past three years and is willing to go back. Social work Engineer, agricultural for rehab at a SNF, which would require a 3 night  qualfying inpatient stay. Patient verbalized his understanding.   Fl2 completed.   Employment status:  Retired Forensic scientist:  Designer, jewellery) PT Recommendations:  Foster / Referral to community resources:     Patient/Family's Response to care:  Patient has agreed to go back to [facility] when discharged.   Patient/Family's Understanding of and Emotional Response to Diagnosis, Current Treatment, and Prognosis:  Patient thanked social work Theatre manager for coming by.   Emotional Assessment Appearance:  Appears stated age Attitude/Demeanor/Rapport:    Affect (typically observed):  Accepting, Adaptable, Pleasant Orientation:  Oriented to Self, Oriented to Place, Oriented to  Time, Oriented to Situation Alcohol / Substance use:  Not Applicable Psych involvement (Current and /or in the community):  No (Comment)  Discharge Needs  Concerns to be addressed:  Basic Needs Readmission within the last 30 days:  No Current discharge risk:  None Barriers to Discharge:  Continued Medical Work up   [facility], [place] Work 04/10/2016, 10:45 AM

## 2016-04-10 NOTE — Progress Notes (Signed)
[facility] [place] NAME: Troy Orr    MR#:  [id number]  DATE OF BIRTH:  04-29-1922  SUBJECTIVE:  CHIEF COMPLAINT:   Chief Complaint  Patient presents with  . Foot Pain    Right      Came with pain and redness on right foot.   On broad spectrum Abx.  REVIEW OF SYSTEMS:  CONSTITUTIONAL: No fever, fatigue or weakness.  EYES: No blurred or double vision.  EARS, NOSE, AND THROAT: No tinnitus or ear pain.  RESPIRATORY: No cough, shortness of breath, wheezing or hemoptysis.  CARDIOVASCULAR: No chest pain, orthopnea, edema.  GASTROINTESTINAL: No nausea, vomiting, diarrhea or abdominal pain.  GENITOURINARY: No dysuria, hematuria.  ENDOCRINE: No polyuria, nocturia,  HEMATOLOGY: No anemia, easy bruising or bleeding SKIN: No rash or lesion. MUSCULOSKELETAL: No joint pain or arthritis.   NEUROLOGIC: No tingling, numbness, weakness.  PSYCHIATRY: No anxiety or depression.   ROS  DRUG ALLERGIES:  No Known Allergies  VITALS:  Blood pressure 125/83, pulse 99, temperature 98.1 F (36.7 C), temperature source Oral, resp. rate 16, height 5\' 9"  (1.753 m), weight 73 kg (161 lb), SpO2 91 %.  PHYSICAL EXAMINATION:   GENERAL:  82 y.o.-year-old patient lying in the bed with no acute distress.  EYES: Pupils equal, round, reactive to light and accommodation. No scleral icterus. Extraocular muscles intact.  HEENT: Head atraumatic, normocephalic. Oropharynx and nasopharynx clear.  NECK:  Supple, no jugular venous distention. No thyroid enlargement, no tenderness.  LUNGS: Normal breath sounds bilaterally, no wheezing, rales,rhonchi or crepitation. No use of accessory muscles of respiration.  CARDIOVASCULAR: S1, S2 normal. No murmurs, rubs, or gallops.  ABDOMEN: Soft, nontender, nondistended. Bowel sounds present. No organomegaly or mass.  EXTREMITIES: Right lower extremity and pedal edema of approximately 1-2+, some Calf tenderness on palpation on  the right, no cyanosis, or clubbing. Right fifth toe base has few ulcers, lateral and plantar surface, blackish wound base on plantar surface ulcer, minimal purulence on the lateral ulceration but no drainage, erythema extending to 2 lower thirds of the tibia, increased warmth, but no significant tenderness on palpation of the foot itself, tender calf muscles on the right significant skin scaling  NEUROLOGIC: Cranial nerves II through XII are intact. Muscle strength 5/5 in all extremities. Sensation diminished to light touch in distal lower extremities . Gait not checked.  PSYCHIATRIC: The patient is alert and oriented x 3.  SKIN: Right foot two ulcerations were noted at fifth toe base.   Physical Exam LABORATORY PANEL:   CBC  Recent Labs Lab 04/10/16 0428  WBC 9.4  HGB 12.0*  HCT 35.7*  PLT 134*   ------------------------------------------------------------------------------------------------------------------  Chemistries   Recent Labs Lab 05/05/2016 1622  04/10/16 0428  NA 135  --  138  K 4.6  --  4.0  CL 102  --  109  CO2 22  --  23  GLUCOSE 182*  --  191*  BUN 35*  --  32*  CREATININE 1.41*  < > 1.20  CALCIUM 9.0  --  8.0*  AST 25  --   --   ALT 20  --   --   ALKPHOS 79  --   --   BILITOT 0.4  --   --   < > = values in this interval not displayed. ------------------------------------------------------------------------------------------------------------------  Cardiac Enzymes  Recent Labs Lab 04/10/16 0428 04/10/16 1006  TROPONINI 0.15* 0.20*   ------------------------------------------------------------------------------------------------------------------  RADIOLOGY:  Dg Chest [facility]  1 View  Result Date: 04/16/2016 CLINICAL DATA:  Foot ulcer and pain, tachycardia EXAM: PORTABLE CHEST 1 VIEW COMPARISON:  01/19/2013 FINDINGS: Cardiac shadow is enlarged. Postsurgical changes are again seen. The lungs are well aerated bilaterally without focal infiltrate or  sizable effusion. Postsurgical changes in the cervical spine are noted. No acute bony abnormality is seen. IMPRESSION: No active disease. Electronically Signed   By: Inez Catalina M.D.   On: 04/29/2016 17:37   Dg Foot Complete Right  Result Date: 04/29/2016 CLINICAL DATA:  Foot wound laterally with drainage, possible osteomyelitis EXAM: RIGHT FOOT COMPLETE - 3+ VIEW COMPARISON:  None. FINDINGS: Hallux valgus deformity is noted with degenerative changes of the first MTP joint. An ulcer is noted adjacent to the head of the fifth metatarsal although no bony erosion to suggest osteomyelitis is seen. No acute fracture or dislocation is seen. Vascular calcifications are noted. IMPRESSION: Chronic changes without acute abnormality. No findings to suggest osteomyelitis. Electronically Signed   By: Inez Catalina M.D.   On: 04/16/2016 17:38    ASSESSMENT AND PLAN:   Active Problems:   Sepsis ([place])   Osteomyelitis of right foot (HCC)   Acute renal insufficiency   Leukocytosis   Pressure injury of skin   #1. Sepsis due to right foot osteomyelitis/cellulitis, admit patient to medical floor, blood cultures are taken, patient is initiated on broad-spectrum antibiotic therapy with Zosyn and vancomycin,   evaluated by podiatrist for possible procedure #2. Osteomyelitis/cellulitis of right, continue broad-spectrum antibody therapy, get an MRSA PCR #3. Acute renal insufficiency, continue IV fluids, follow creatinine in the morning #4. Leukocytosis, follow with antibiotic therapy #5 Lactic acidosis, follow lactic acid level closely. Continue IV fluids, supportive therapy.  #6 sinus tachycardia, due to lactic acidosis, normal TSH, follow cardiac enzymes 3, continue metoprolol    All the records are reviewed and case discussed with Care Management/Social Workerr. Management plans discussed with the patient, family and they are in agreement.  CODE STATUS: full  TOTAL TIME TAKING CARE OF THIS PATIENT: 35  minutes.   POSSIBLE D/C IN 1-2 DAYS, DEPENDING ON CLINICAL CONDITION.   Vaughan Basta M.D on 04/10/2016   Between 7am to 6pm - Pager - [phone]  After 6pm go to [email or web address] - password [email or web address] [facility]  Office  [phone]  CC: Primary care physician; Idelle Crouch, MD  Note: This dictation was prepared with Dragon dictation along with smaller phrase technology. Any transcriptional errors that result from this process are unintentional.

## 2016-04-10 NOTE — NC FL2 (Signed)
[place] LEVEL OF CARE SCREENING TOOL     IDENTIFICATION  Patient Name: Troy Orr Birthdate: Jan 04, 1922 Sex: male Admission Date (Current Location): 04/26/2016  [facility] and [place] Number:      Facility and Address:  [facility], [address] [place], [place] [address]      Provider Number: [id number]  Attending Physician Name and Address:  Vaughan Basta, MD  Relative Name and Phone Number:       Current Level of Care: Hospital Recommended Level of Care: Troy Prior Approval Number:    Date Approved/Denied:   PASRR Number:    Discharge Plan: Domiciliary (Rest home)    Current Diagnoses: Patient Active Problem List   Diagnosis Date Noted  . Sepsis ([place]) 04/14/2016  . Osteomyelitis of right foot ([place]) 04/29/2016  . Acute renal insufficiency 04/29/2016  . Leukocytosis 04/20/2016    Orientation RESPIRATION BLADDER Height & Weight     Self, Time, Situation, Place  Normal Incontinent Weight: 161 lb (73 kg) Height:  5\' 9"  (175.3 cm)  BEHAVIORAL SYMPTOMS/MOOD NEUROLOGICAL BOWEL NUTRITION STATUS   (none )  (none ) Incontinent Diet (Diet: Carb Modified )  AMBULATORY STATUS COMMUNICATION OF NEEDS Skin   Supervision Verbally PU Stage and Appropriate Care (Pressure Ulcer Stage 3: right foot. )                       Personal Care Assistance Level of Assistance  Bathing, Feeding, Dressing Bathing Assistance: Limited assistance Feeding assistance: Independent Dressing Assistance: Limited assistance     Functional Limitations Info  Sight, Hearing, Speech Sight Info: Adequate Hearing Info: Adequate Speech Info: Adequate    SPECIAL CARE FACTORS FREQUENCY  PT (By licensed PT)     PT Frequency:  (Home Health PT 2-3 days per week )              Contractures      Additional Factors Info  Code Status, Allergies, Insulin Sliding Scale Code Status Info:  (Full Code. ) Allergies  Info:  (No Known Allergies. )   Insulin Sliding Scale Info:  (NovoLog Insulin Injections )       Current Medications (04/10/2016):  This is the current hospital active medication list Current Facility-Administered Medications  Medication Dose Route Frequency Provider Last Rate Last Dose  . acetaminophen (TYLENOL) tablet 650 mg  650 mg Oral Q6H PRN Theodoro Grist, MD       Or  . acetaminophen (TYLENOL) suppository 650 mg  650 mg Rectal Q6H PRN Theodoro Grist, MD      . amLODipine (NORVASC) tablet 5 mg  5 mg Oral Daily Theodoro Grist, MD   5 mg at 05/02/2016 2220  . aspirin EC tablet 81 mg  81 mg Oral Daily Theodoro Grist, MD      . docusate sodium (COLACE) capsule 100 mg  100 mg Oral BID Theodoro Grist, MD   100 mg at 04/07/2016 2220  . enoxaparin (LOVENOX) injection 40 mg  40 mg Subcutaneous Q24H Theodoro Grist, MD   40 mg at 04/08/2016 2218  . HYDROcodone-acetaminophen (NORCO/VICODIN) 5-325 MG per tablet 1-2 tablet  1-2 tablet Oral Q4H PRN Theodoro Grist, MD   2 tablet at 04/10/16 0505  . insulin aspart (novoLOG) injection 0-5 Units  0-5 Units Subcutaneous QHS Theodoro Grist, MD      . insulin aspart (novoLOG) injection 0-9 Units  0-9 Units Subcutaneous TID WC Theodoro Grist, MD      .  insulin aspart (novoLOG) injection 4 Units  4 Units Subcutaneous TID WC Theodoro Grist, MD      . linagliptin (TRADJENTA) tablet 5 mg  5 mg Oral Daily Theodoro Grist, MD   5 mg at 04/12/2016 2227  . meclizine (ANTIVERT) tablet 25 mg  25 mg Oral TID PRN Theodoro Grist, MD      . metoprolol (LOPRESSOR) tablet 50 mg  50 mg Oral BID Theodoro Grist, MD   50 mg at 04/24/2016 2220  . ondansetron (ZOFRAN) tablet 4 mg  4 mg Oral Q6H PRN Theodoro Grist, MD       Or  . ondansetron (ZOFRAN) injection 4 mg  4 mg Intravenous Q6H PRN Theodoro Grist, MD      . pantoprazole (PROTONIX) EC tablet 40 mg  40 mg Oral Daily Theodoro Grist, MD      . piperacillin-tazobactam (ZOSYN) IVPB 3.375 g  3.375 g Intravenous Q8H Sheema M Hallaji, RPH   3.375 g at  04/10/16 0505  . polycarbophil (FIBERCON) tablet 625 mg  625 mg Oral BID Theodoro Grist, MD   625 mg at 04/15/2016 2220  . polyethylene glycol (MIRALAX / GLYCOLAX) packet 17 g  17 g Oral Daily Theodoro Grist, MD      . senna (SENOKOT) tablet 8.6 mg  1 tablet Oral Daily Theodoro Grist, MD   8.6 mg at 04/19/2016 2220  . simethicone (MYLICON) chewable tablet 80 mg  80 mg Oral QID Theodoro Grist, MD   80 mg at 04/08/2016 2220  . simvastatin (ZOCOR) tablet 40 mg  40 mg Oral Daily Theodoro Grist, MD   40 mg at 04/25/2016 2220  . sodium chloride flush (NS) 0.9 % injection 3 mL  3 mL Intravenous Q12H Theodoro Grist, MD      . traZODone (DESYREL) tablet 50 mg  50 mg Oral QHS Theodoro Grist, MD   50 mg at 04/24/2016 2220  . vancomycin (VANCOCIN) IVPB 1000 mg/200 mL premix  1,000 mg Intravenous Q24H Sheema M Hallaji, RPH   1,000 mg at 04/28/2016 2308  . vitamin B-12 (CYANOCOBALAMIN) tablet 1,000 mcg  1,000 mcg Oral Daily Theodoro Grist, MD   1,000 mcg at 04/21/2016 2220     Discharge Medications: Please see discharge summary for a list of discharge medications.  Relevant Imaging Results:  Relevant Lab Results:   Additional Information  (SSN: [id number])  Sample, Veronia Beets, LCSW

## 2016-04-10 NOTE — Progress Notes (Signed)
Second troponin 0.15. Dr. Marcille Blanco notified and no new orders received.

## 2016-04-10 NOTE — Care Management (Signed)
Met with patient to discuss discharge planning. He is from [facility]. He uses wheelchair and walker to ambulate. CSW following patient. RNCM watch patient for VAC and IV ABX need at discharge.

## 2016-04-11 DIAGNOSIS — E119 Type 2 diabetes mellitus without complications: Secondary | ICD-10-CM

## 2016-04-11 DIAGNOSIS — I2510 Atherosclerotic heart disease of native coronary artery without angina pectoris: Secondary | ICD-10-CM

## 2016-04-11 DIAGNOSIS — R509 Fever, unspecified: Secondary | ICD-10-CM

## 2016-04-11 DIAGNOSIS — M79605 Pain in left leg: Secondary | ICD-10-CM

## 2016-04-11 LAB — BASIC METABOLIC PANEL WITH GFR
Anion gap: 11 (ref 5–15)
BUN: 30 mg/dL — ABNORMAL HIGH (ref 6–20)
CO2: 19 mmol/L — ABNORMAL LOW (ref 22–32)
Calcium: 8.3 mg/dL — ABNORMAL LOW (ref 8.9–10.3)
Chloride: 109 mmol/L (ref 101–111)
Creatinine, Ser: 1.36 mg/dL — ABNORMAL HIGH (ref 0.61–1.24)
GFR calc Af Amer: 50 mL/min — ABNORMAL LOW
GFR calc non Af Amer: 44 mL/min — ABNORMAL LOW
Glucose, Bld: 96 mg/dL (ref 65–99)
Potassium: 4 mmol/L (ref 3.5–5.1)
Sodium: 139 mmol/L (ref 135–145)

## 2016-04-11 LAB — HEMOGLOBIN A1C
Hgb A1c MFr Bld: 7.9 % — ABNORMAL HIGH (ref 4.8–5.6)
MEAN PLASMA GLUCOSE: 180 mg/dL

## 2016-04-11 LAB — GLUCOSE, CAPILLARY
GLUCOSE-CAPILLARY: 176 mg/dL — AB (ref 65–99)
GLUCOSE-CAPILLARY: 68 mg/dL (ref 65–99)
GLUCOSE-CAPILLARY: 70 mg/dL (ref 65–99)
Glucose-Capillary: 106 mg/dL — ABNORMAL HIGH (ref 65–99)

## 2016-04-11 LAB — VANCOMYCIN, TROUGH: VANCOMYCIN TR: 19 ug/mL (ref 15–20)

## 2016-04-11 MED ORDER — CHLORHEXIDINE GLUCONATE CLOTH 2 % EX PADS: 6.0000 | MEDICATED_PAD | Freq: Once | CUTANEOUS | Status: DC

## 2016-04-11 MED ORDER — SODIUM CHLORIDE 0.9 % IV SOLN
INTRAVENOUS | Status: DC
  Administered 2016-04-13 (×2): via INTRAVENOUS

## 2016-04-11 MED ORDER — CHLORHEXIDINE GLUCONATE CLOTH 2 % EX PADS
6.0000 | MEDICATED_PAD | Freq: Once | CUTANEOUS | Status: DC
Start: 2016-04-12 — End: 2016-04-11

## 2016-04-11 MED ORDER — SODIUM CHLORIDE 0.9 % IV SOLN
INTRAVENOUS | Status: DC
Start: 2016-04-11 — End: 2016-04-15
  Administered 2016-04-13 – 2016-04-15 (×7): via INTRAVENOUS

## 2016-04-11 MED ORDER — SODIUM CHLORIDE 0.9 % IV SOLN: INTRAVENOUS | Status: DC

## 2016-04-11 MED ORDER — CHLORHEXIDINE GLUCONATE CLOTH 2 % EX PADS
6.0000 | MEDICATED_PAD | Freq: Once | CUTANEOUS | Status: DC
Start: 2016-04-12 — End: 2016-04-13

## 2016-04-11 NOTE — Progress Notes (Signed)
This RN was notified by CCMD that patient had a multifacial 3 beat run of v-tach. MD notified. No new orders at this time. Patient asymptomatic.

## 2016-04-11 NOTE — Progress Notes (Signed)
Pharmacy Antibiotic Note  Troy Orr is a 82 y.o. male admitted on 04/29/2016 with Sepsis due to right foot osteomyelitis/cellulitis.  Pharmacy has been consulted for Vancomycin and Zosyn dosing. Patient received Vancomycin 1gm IV x1 and Zosyn 3.375 Iv x1 in ED.   Currently on Vancomycin 1g IV q24h and Zosyn 3.375g IV q8h EI. Patient had slight bump in SCr today from 1.20 to 1.36.  Plan: Will continue current antibiotic regimen. Will check a Vancomycin torugh level prior to 10/6 dose to assess for clearance. This will not be a steady state level. Will also check a SCr with 10/7 AM labs.  Height: 5\' 9"  (175.3 cm) Weight: 161 lb (73 kg) IBW/kg (Calculated) : 70.7  Temp (24hrs), Avg:97.8 F (36.6 C), Min:97.5 F (36.4 C), Max:98.1 F (36.7 C)   Recent Labs Lab 04/07/2016 1622 05/03/2016 1926 05/02/2016 2222 04/10/16 0428 04/11/16 0358  WBC 15.6*  --  11.8* 9.4  --   CREATININE 1.41*  --  1.18 1.20 1.36*  LATICACIDVEN 4.0* 2.0* 1.7  --   --     Estimated Creatinine Clearance: 34.7 mL/min (by C-G formula based on SCr of 1.36 mg/dL (H)).    No Known Allergies  Antimicrobials this admission: 10/4 Zosyn >>  10/4 Vancomycin >>   Dose adjustments this admission:  Microbiology results: 10/4 BCx: pending  10/4 Wound Cx: pending  10/4 UCx: pending 10/4 MRSA PCR:   Thank you for allowing pharmacy to be a part of this patient's care.  Nancy Fetter, PharmD Clinical Pharmacist 04/11/2016 11:51 AM

## 2016-04-11 NOTE — Care Management (Signed)
Spoke with attending regarding discharge disposition.  Patient is from an assisted living facility.  If patient requires wound vac and or IV antibiotic therapy, it is doubtful patient could return to assisted living level of care.   Vascular is following and patient to have angiogram of right lower extremity

## 2016-04-11 NOTE — Progress Notes (Signed)
Patient is A&O x4, HOH. Blood sugars 106, 176, and 149, received SS insulin coverage. RLE, redness, has decreased. Dressing to RLE is CD&I, and placed by MD today. PPP, but weak. Meds crushed in applesauce. LBM 10/4, per chart. Edema to RLE noted. On tele. Bed alarm on for safety.

## 2016-04-11 NOTE — Progress Notes (Signed)
[facility] [place] NAME: Troy Orr    MR#:  [id number]  DATE OF BIRTH:  [birth year]  SUBJECTIVE:  CHIEF COMPLAINT:   Chief Complaint  Patient presents with  . Foot Pain    Right      Came with pain and redness on right foot.   On broad spectrum Abx.   Feels same today. He had no bowel movement in last 5-6 days as per him.  REVIEW OF SYSTEMS:  CONSTITUTIONAL: No fever, fatigue or weakness.  EYES: No blurred or double vision.  EARS, NOSE, AND THROAT: No tinnitus or ear pain.  RESPIRATORY: No cough, shortness of breath, wheezing or hemoptysis.  CARDIOVASCULAR: No chest pain, orthopnea, edema.  GASTROINTESTINAL: No nausea, vomiting, diarrhea or abdominal pain.  GENITOURINARY: No dysuria, hematuria.  ENDOCRINE: No polyuria, nocturia,  HEMATOLOGY: No anemia, easy bruising or bleeding SKIN: No rash or lesion. MUSCULOSKELETAL: No joint pain or arthritis.   NEUROLOGIC: No tingling, numbness, weakness.  PSYCHIATRY: No anxiety or depression.   ROS  DRUG ALLERGIES:  No Known Allergies  VITALS:  Blood pressure (!) 145/87, pulse (!) 111, temperature 98.1 F (36.7 C), temperature source Oral, resp. rate 18, height 5\' 9"  (1.753 m), weight 73 kg (161 lb), SpO2 98 %.  PHYSICAL EXAMINATION:   GENERAL:  82 y.o.-year-old patient lying in the bed with no acute distress.  EYES: Pupils equal, round, reactive to light and accommodation. No scleral icterus. Extraocular muscles intact.  HEENT: Head atraumatic, normocephalic. Oropharynx and nasopharynx clear.  NECK:  Supple, no jugular venous distention. No thyroid enlargement, no tenderness.  LUNGS: Normal breath sounds bilaterally, no wheezing, rales,rhonchi or crepitation. No use of accessory muscles of respiration.  CARDIOVASCULAR: S1, S2 normal. No murmurs, rubs, or gallops.  ABDOMEN: Soft, nontender, nondistended. Bowel sounds present. No organomegaly or mass.  EXTREMITIES: Right lower  extremity and pedal edema of approximately 1-2+, some Calf tenderness on palpation on the right, no cyanosis, or clubbing. Right fifth toe base has few ulcers, lateral and plantar surface, blackish wound base on plantar surface ulcer, minimal purulence on the lateral ulceration but no drainage, erythema extending to 2 lower thirds of the tibia, increased warmth, but no significant tenderness on palpation of the foot itself, tender calf muscles on the right significant skin scaling  NEUROLOGIC: Cranial nerves II through XII are intact. Muscle strength 5/5 in all extremities. Sensation diminished to light touch in distal lower extremities . Gait not checked.  PSYCHIATRIC: The patient is alert and oriented x 3.  SKIN: Right foot two ulcerations were noted at fifth toe base. Necrotic superficial skin over the area.  Physical Exam LABORATORY PANEL:   CBC  Recent Labs Lab 04/10/16 0428  WBC 9.4  HGB 12.0*  HCT 35.7*  PLT 134*   ------------------------------------------------------------------------------------------------------------------  Chemistries   Recent Labs Lab 05/04/2016 1622  04/11/16 0358  NA 135  < > 139  K 4.6  < > 4.0  CL 102  < > 109  CO2 22  < > 19*  GLUCOSE 182*  < > 96  BUN 35*  < > 30*  CREATININE 1.41*  < > 1.36*  CALCIUM 9.0  < > 8.3*  AST 25  --   --   ALT 20  --   --   ALKPHOS 79  --   --   BILITOT 0.4  --   --   < > = values in this interval not displayed. ------------------------------------------------------------------------------------------------------------------  Cardiac Enzymes  Recent Labs Lab 04/10/16 0428 04/10/16 1006  TROPONINI 0.15* 0.20*   ------------------------------------------------------------------------------------------------------------------  RADIOLOGY:  Dg Chest Port 1 View  Result Date: 04/06/2016 CLINICAL DATA:  Foot ulcer and pain, tachycardia EXAM: PORTABLE CHEST 1 VIEW COMPARISON:  01/19/2013 FINDINGS: Cardiac  shadow is enlarged. Postsurgical changes are again seen. The lungs are well aerated bilaterally without focal infiltrate or sizable effusion. Postsurgical changes in the cervical spine are noted. No acute bony abnormality is seen. IMPRESSION: No active disease. Electronically Signed   By: Inez Catalina M.D.   On: 04/15/2016 17:37   Dg Foot Complete Right  Result Date: 04/16/2016 CLINICAL DATA:  Foot wound laterally with drainage, possible osteomyelitis EXAM: RIGHT FOOT COMPLETE - 3+ VIEW COMPARISON:  None. FINDINGS: Hallux valgus deformity is noted with degenerative changes of the first MTP joint. An ulcer is noted adjacent to the head of the fifth metatarsal although no bony erosion to suggest osteomyelitis is seen. No acute fracture or dislocation is seen. Vascular calcifications are noted. IMPRESSION: Chronic changes without acute abnormality. No findings to suggest osteomyelitis. Electronically Signed   By: Inez Catalina M.D.   On: 04/08/2016 17:38    ASSESSMENT AND PLAN:   Active Problems:   Sepsis ([place])   Osteomyelitis of right foot (HCC)   Acute renal insufficiency   Leukocytosis   Pressure injury of skin   #1. Sepsis due to right foot osteomyelitis/cellulitis, admit patient to medical floor, blood cultures are taken, patient is initiated on broad-spectrum antibiotic therapy with Zosyn and vancomycin,   evaluated by podiatrist for likely 5th metatarsal ray resection, after re-vascularization procedure.   Also seen by vascular surgery- plan for angioplasty on Monday. #2. Osteomyelitis/cellulitis of right, continue broad-spectrum antibody therapy, get an MRSA PCR #3. Acute renal insufficiency, continue IV fluids, follow creatinine in the morning   As he will need vascular procedure, will keep well hydrated. #4. Leukocytosis, follow with antibiotic therapy #5 Lactic acidosis, follow lactic acid level closely. Continue IV fluids, supportive therapy.  #6 sinus tachycardia, due to lactic  acidosis, normal TSH, follow cardiac enzymes 3, continue metoprolol   All the records are reviewed and case discussed with Care Management/Social Workerr. Management plans discussed with the patient, family and they are in agreement.  CODE STATUS: full  TOTAL TIME TAKING CARE OF THIS PATIENT: 35 minutes.   POSSIBLE D/C IN 4-5  DAYS, DEPENDING ON CLINICAL CONDITION.   Vaughan Basta M.D on 04/11/2016   Between 7am to 6pm - Pager - [phone]  After 6pm go to [email or web address] - password [email or web address] [facility]  Office  [phone]  CC: Primary care physician; Idelle Crouch, MD  Note: This dictation was prepared with Dragon dictation along with smaller phrase technology. Any transcriptional errors that result from this process are unintentional.

## 2016-04-11 NOTE — Consult Note (Signed)
Patient Demographics  Troy Orr, is a 82 y.o. male   MRN: [id number]   DOB - 10-Jul-1921  Admit Date - 04/22/2016    Outpatient Primary MD for the patient is SPARKS,JEFFREY D, MD  Consult requested in the Hospital by Vaughan Basta, MD, On 04/11/2016    Reason for consult diabetic foot ulceration with infection on the right foot   With History of -  Past Medical History:  Diagnosis Date  . Arthritis   . Cancer [facility])    Colon   . Diabetes ([place])   . Heart trouble       Past Surgical History:  Procedure Laterality Date  . heart bypass      in for   Chief Complaint  Patient presents with  . Foot Pain    Right     HPI  Troy Orr  is a 82 y.o. male, Patient has been followed by Dr. Roselind Messier [facility]. For the last couple years. Patient states he had a callused deform on the bottom of the foot that would break down some times but he thinks he got away from him this time. He was seen yesterday in an office environment and was referred to the emergency room because of the condition of his foot. He came to the emergency room yesterday and was admitted and started on IV antibiotics. Also been seen by Dr. Leotis Pain from [facility] and has a planned angioplasty on Monday.    Review of Systems  patient is alert and well-oriented. His mental status and responses are within normal limits for his clinical condition.  In addSocial History Social History  Substance Use Topics  . Smoking status: Never Smoker  . Smokeless tobacco: Never Used  . Alcohol use No    Family History History reviewed. No pertinent family history.   Prior to Admission medications   Medication Sig Start Date End Date Taking? Authorizing Provider  acetaminophen (TYLENOL) 325 MG tablet Take 650  mg by mouth every 6 (six) hours as needed.   Yes Historical Provider, MD  aspirin 81 MG tablet Take 81 mg by mouth daily.   Yes Historical Provider, MD  Calcium Carbonate Antacid (TUMS E-X SUGAR FREE PO) Take 1 tablet by mouth every 4 (four) hours as needed.   Yes Historical Provider, MD  carbamide peroxide (DEBROX) 6.5 % otic solution Place 5 drops into both ears 2 (two) times daily as needed.   Yes Historical Provider, MD  diphenoxylate-atropine (LOMOTIL) 2.5-0.025 MG tablet Take 1 tablet by mouth as needed for diarrhea or loose stools.   Yes Historical Provider, MD  guaifenesin (DIABETIC TUSSIN EX) 100 MG/5ML syrup Take 200 mg by mouth every 4 (four) hours as needed for cough.   Yes Historical Provider, MD  hydrOXYzine (ATARAX/VISTARIL) 25 MG tablet Take 25 mg by mouth every 6 (six) hours as needed.   Yes Historical Provider, MD  metFORMIN (GLUCOPHAGE) 1000 MG tablet Take 500 mg by mouth 3 (three) times daily.    Yes Historical Provider, MD  metoprolol succinate (TOPROL-XL) 50 MG 24 hr tablet  11/16/13  Yes Historical Provider, MD  Multiple Vitamin (DAILY VITE) TABS Take 1  tablet by mouth daily.   Yes Historical Provider, MD  omeprazole (PRILOSEC) 20 MG capsule Take 20 mg by mouth daily.   Yes Historical Provider, MD  ondansetron (ZOFRAN) 4 MG tablet Take 4 mg by mouth every 8 (eight) hours as needed for nausea or vomiting.   Yes Historical Provider, MD  polycarbophil (FIBERCON) 625 MG tablet Take 625 mg by mouth 2 (two) times daily.   Yes Historical Provider, MD  polyethylene glycol (MIRALAX / GLYCOLAX) packet Take 17 g by mouth daily.   Yes Historical Provider, MD  simethicone (MYLICON) 0000000 MG chewable tablet Chew 125 mg by mouth 4 (four) times daily -  before meals and at bedtime.   Yes Historical Provider, MD  torsemide (DEMADEX) 20 MG tablet Take 20 mg by mouth once.  11/30/13  Yes Historical Provider, MD  traZODone (DESYREL) 50 MG tablet Take 50 mg by mouth at bedtime.   Yes Historical  Provider, MD  vitamin B-12 (CYANOCOBALAMIN) 1000 MCG tablet Take 1,000 mcg by mouth daily.   Yes Historical Provider, MD    Anti-infectives    Start     Dose/Rate Route Frequency Ordered Stop   04/10/16 0000  vancomycin (VANCOCIN) IVPB 1000 mg/200 mL premix     1,000 mg 200 mL/hr over 60 Minutes Intravenous Every 24 hours 04/14/2016 1926     05/03/2016 1924  piperacillin-tazobactam (ZOSYN) IVPB 3.375 g     3.375 g 12.5 mL/hr over 240 Minutes Intravenous Every 8 hours 04/13/2016 1926     04/15/2016 1700  piperacillin-tazobactam (ZOSYN) IVPB 3.375 g     3.375 g 100 mL/hr over 30 Minutes Intravenous  Once 05/03/2016 1649 04/08/2016 1737   04/15/2016 1700  vancomycin (VANCOCIN) IVPB 1000 mg/200 mL premix     1,000 mg 200 mL/hr over 60 Minutes Intravenous  Once 04/18/2016 1649 04/18/2016 1807      Scheduled Meds: . amLODipine  5 mg Oral Daily  . aspirin EC  81 mg Oral Daily  . [START ON 04/12/2016] Chlorhexidine Gluconate Cloth  6 each Topical Once  . docusate sodium  100 mg Oral BID  . enoxaparin (LOVENOX) injection  40 mg Subcutaneous Q24H  . insulin aspart  0-5 Units Subcutaneous QHS  . insulin aspart  0-9 Units Subcutaneous TID WC  . insulin aspart  4 Units Subcutaneous TID WC  . linagliptin  5 mg Oral Daily  . metoprolol  50 mg Oral BID  . pantoprazole  40 mg Oral Daily  . piperacillin-tazobactam (ZOSYN)  IV  3.375 g Intravenous Q8H  . polycarbophil  625 mg Oral BID  . polyethylene glycol  17 g Oral Daily  . senna  1 tablet Oral Daily  . simethicone  80 mg Oral QID  . simvastatin  40 mg Oral Daily  . sodium chloride flush  3 mL Intravenous Q12H  . traZODone  50 mg Oral QHS  . vancomycin  1,000 mg Intravenous Q24H  . vitamin B-12  1,000 mcg Oral Daily   Continuous Infusions: . [START ON 04/12/2016] sodium chloride     PRN Meds:.acetaminophen **OR** acetaminophen, HYDROcodone-acetaminophen, meclizine, ondansetron **OR** ondansetron (ZOFRAN) IV  No Known Allergies  Physical  Exam  Vitals  Blood pressure (!) 145/87, pulse (!) 111, temperature 98.1 F (36.7 C), temperature source Oral, resp. rate 18, height 5\' 9"  (1.753 m), weight 73 kg (161 lb), SpO2 98 %.  Lower Extremity exam:  Vascular:DP and PT pulses are nonpalpable bilaterally  Dermatological: Patient has 2 wounds on the right  fifth metatarsal area. One is medially and has heavy drainage along with necrosis of superficial skin and deeper soft tissue. No lesions plantarly at the fifth metatarsal head with similar necrotic findings. Some heavy purulence is noted from that region. Not a lot of extensive redness or cellulitis progressing proximally however. Patient states actually looks better today than yesterday.  Neurological: Patient obviously has peripheral diabetic neuropathy but he was able to fill some of that debridement on the dorsal aspect and lateral aspect of the fifth metatarsal but not the plantar aspect.  Ortho: X-rays were negative for any type of osseous change associated osteomyelitis but the depth of the wounds probe all the way to the capsular tissue and with the ulcerations in the degree and stated that her and there is a high risk for osteomyelitis to the fifth metatarsal and likely proximal phalanx as well.  Data Review  CBC  Recent Labs Lab 04/25/2016 1622 04/22/2016 2222 04/10/16 0428  WBC 15.6* 11.8* 9.4  HGB 13.2 12.2* 12.0*  HCT 39.4* 37.1* 35.7*  PLT 164 137* 134*  MCV 96.8 96.4 96.8  MCH 32.4 31.8 32.7  MCHC 33.5 33.0 33.7  RDW 15.6* 15.5* 15.6*  LYMPHSABS 1.5  --   --   MONOABS 1.2*  --   --   EOSABS 0.0  --   --   BASOSABS 0.1  --   --    ------------------------------------------------------------------------------------------------------------------  Chemistries   Recent Labs Lab 04/22/2016 1622 04/16/2016 2222 04/10/16 0428 04/11/16 0358  NA 135  --  138 139  K 4.6  --  4.0 4.0  CL 102  --  109 109  CO2 22  --  23 19*  GLUCOSE 182*  --  191* 96  BUN 35*   --  32* 30*  CREATININE 1.41* 1.18 1.20 1.36*  CALCIUM 9.0  --  8.0* 8.3*  AST 25  --   --   --   ALT 20  --   --   --   ALKPHOS 79  --   --   --   BILITOT 0.4  --   --   --    ------------------------------------------------------------------------------------------------------------------ estimated creatinine clearance is 34.7 mL/min (by C-G formula based on SCr of 1.36 mg/dL (H)). ------------------------------------------------------------------------------------------------------------------ Imaging results:   Dg Chest Port 1 View  Result Date: 04/14/2016 CLINICAL DATA:  Foot ulcer and pain, tachycardia EXAM: PORTABLE CHEST 1 VIEW COMPARISON:  01/19/2013 FINDINGS: Cardiac shadow is enlarged. Postsurgical changes are again seen. The lungs are well aerated bilaterally without focal infiltrate or sizable effusion. Postsurgical changes in the cervical spine are noted. No acute bony abnormality is seen. IMPRESSION: No active disease. Electronically Signed   By: Inez Catalina M.D.   On: 04/16/2016 17:37   Dg Foot Complete Right  Result Date: 04/28/2016 CLINICAL DATA:  Foot wound laterally with drainage, possible osteomyelitis EXAM: RIGHT FOOT COMPLETE - 3+ VIEW COMPARISON:  None. FINDINGS: Hallux valgus deformity is noted with degenerative changes of the first MTP joint. An ulcer is noted adjacent to the head of the fifth metatarsal although no bony erosion to suggest osteomyelitis is seen. No acute fracture or dislocation is seen. Vascular calcifications are noted. IMPRESSION: Chronic changes without acute abnormality. No findings to suggest osteomyelitis. Electronically Signed   By: Inez Catalina M.D.   On: 04/20/2016 17:38    Assessment & Plan: Patient has a severe diabetic wound with infection to the fifth metatarsal head. High possibility of osteomyelitis to the fifth metatarsal  head. Necrotic and gangrenous changes to the tissues in the region. X-rays were reviewed and show extensive small  vessel calcification throughout the foot. This may pose a difficulty in trying to get increased blood flow to the region. Plan: At bedside a use a 15 scalpel blade to excise the necrotic tissue from the plantar lateral side of the digit. This is mostly superficial to allow the area to drain which can help improve the infection process. Ultimately he'll likely need a fifth ray amputation if he has much hope of getting this to heal up at all. He scheduled to have an angioplasty done on Monday. I will speak with Dr. dew regarding this and get his opinion as to whether he would prefer we wait until after his angioplasty to do his surgery. His infection is starting to stabilize and he is starting to see improvement to the redness in the foot. White count is also reducing.  Active Problems:   Sepsis ([place])   Osteomyelitis of right foot (HCC)   Acute renal insufficiency   Leukocytosis   Pressure injury of skin     Family Communication: Plan discussed with patient   Perry Mount M.D on 04/11/2016 at 1:31 PM  Thank you for the consult, we will follow the patient with you in the Hospital.

## 2016-04-11 NOTE — Consult Note (Signed)
[facility] Vascular Consult Note  MRN : [id number]  Troy Orr is a 82 y.o. (01/24/1922) male who presents with chief complaint of  Chief Complaint  Patient presents with  . Foot Pain    Right  .  History of Present Illness: I am asked to see the patient by Dr. Anselm Jungling for a large ulceration on the right foot with infection and suspicion for peripheral arterial disease. By the patient's report, he thinks it is getting better since that started the antibiotics on his admission 2 days ago. He says it hurts less and is less swollen and red. It still reasonably erythematous and about a 3 cm ulcer with a dark eschar is present on the right lateral foot under the base of the fifth metatarsal. His fever curve has improved. He denies any current ischemic rest pain. He denies any trauma or injury or clear inciting event that started the ulceration. He reports no left lower extremity symptoms currently. Local wound care at home was not really improving the wound, but by his report the antibiotics seem to be. He had been following with his outpatient podiatrist, but the wound worsened over several weeks time. He has a previous history of coronary disease but no lower extremity interventions or surgery are reported.  Current Facility-Administered Medications  Medication Dose Route Frequency Provider Last Rate Last Dose  . acetaminophen (TYLENOL) tablet 650 mg  650 mg Oral Q6H PRN Theodoro Grist, MD       Or  . acetaminophen (TYLENOL) suppository 650 mg  650 mg Rectal Q6H PRN Theodoro Grist, MD      . amLODipine (NORVASC) tablet 5 mg  5 mg Oral Daily Theodoro Grist, MD   5 mg at 04/10/16 1044  . aspirin EC tablet 81 mg  81 mg Oral Daily Theodoro Grist, MD   81 mg at 04/10/16 1043  . docusate sodium (COLACE) capsule 100 mg  100 mg Oral BID Theodoro Grist, MD   100 mg at 04/10/16 2043  . enoxaparin (LOVENOX) injection 40 mg  40 mg Subcutaneous Q24H Theodoro Grist, MD   40 mg at  04/10/16 2041  . HYDROcodone-acetaminophen (NORCO/VICODIN) 5-325 MG per tablet 1-2 tablet  1-2 tablet Oral Q4H PRN Theodoro Grist, MD   1 tablet at 04/11/16 0500  . insulin aspart (novoLOG) injection 0-5 Units  0-5 Units Subcutaneous QHS Theodoro Grist, MD      . insulin aspart (novoLOG) injection 0-9 Units  0-9 Units Subcutaneous TID WC Theodoro Grist, MD   1 Units at 04/10/16 1653  . insulin aspart (novoLOG) injection 4 Units  4 Units Subcutaneous TID WC Theodoro Grist, MD   4 Units at 04/10/16 1653  . linagliptin (TRADJENTA) tablet 5 mg  5 mg Oral Daily Theodoro Grist, MD   5 mg at 04/10/16 1044  . meclizine (ANTIVERT) tablet 25 mg  25 mg Oral TID PRN Theodoro Grist, MD      . metoprolol (LOPRESSOR) tablet 50 mg  50 mg Oral BID Theodoro Grist, MD   50 mg at 04/10/16 2044  . ondansetron (ZOFRAN) tablet 4 mg  4 mg Oral Q6H PRN Theodoro Grist, MD       Or  . ondansetron (ZOFRAN) injection 4 mg  4 mg Intravenous Q6H PRN Theodoro Grist, MD      . pantoprazole (PROTONIX) EC tablet 40 mg  40 mg Oral Daily Theodoro Grist, MD   40 mg at 04/10/16 1044  . piperacillin-tazobactam (ZOSYN) IVPB 3.375  g  3.375 g Intravenous Q8H Sheema M Hallaji, RPH   3.375 g at 04/11/16 0500  . polycarbophil (FIBERCON) tablet 625 mg  625 mg Oral BID Theodoro Grist, MD   625 mg at 04/10/16 2044  . polyethylene glycol (MIRALAX / GLYCOLAX) packet 17 g  17 g Oral Daily Theodoro Grist, MD   17 g at 04/10/16 1042  . senna (SENOKOT) tablet 8.6 mg  1 tablet Oral Daily Theodoro Grist, MD   8.6 mg at 04/10/16 1044  . simethicone (MYLICON) chewable tablet 80 mg  80 mg Oral QID Theodoro Grist, MD   80 mg at 04/10/16 2044  . simvastatin (ZOCOR) tablet 40 mg  40 mg Oral Daily Theodoro Grist, MD   40 mg at 04/10/16 1044  . sodium chloride flush (NS) 0.9 % injection 3 mL  3 mL Intravenous Q12H Theodoro Grist, MD   3 mL at 04/10/16 1043  . traZODone (DESYREL) tablet 50 mg  50 mg Oral QHS Theodoro Grist, MD   50 mg at 04/10/16 2044  . vancomycin (VANCOCIN)  IVPB 1000 mg/200 mL premix  1,000 mg Intravenous Q24H Sheema M Hallaji, RPH   1,000 mg at 04/10/16 2327  . vitamin B-12 (CYANOCOBALAMIN) tablet 1,000 mcg  1,000 mcg Oral Daily Theodoro Grist, MD   1,000 mcg at 04/10/16 1044    Past Medical History:  Diagnosis Date  . Arthritis   . Cancer [facility])    Colon   . Diabetes ([place])   . Heart trouble     Past Surgical History:  Procedure Laterality Date  . heart bypass      Social History Social History  Substance Use Topics  . Smoking status: Never Smoker  . Smokeless tobacco: Never Used  . Alcohol use No  No IV drug use  Family History No family history of bleeding disorders, clotting disorders, autoimmune diseases, or aneurysms  No Known Allergies   REVIEW OF SYSTEMS (Negative unless checked)  Constitutional: [] Weight loss  [x] Fever  [x] Chills Cardiac: [] Chest pain   [] Chest pressure   [] Palpitations   [] Shortness of breath when laying flat   [] Shortness of breath at rest   [] Shortness of breath with exertion. Vascular:  [] Pain in legs with walking   [] Pain in legs at rest   [] Pain in legs when laying flat   [] Claudication   [] Pain in feet when walking  [] Pain in feet at rest  [] Pain in feet when laying flat   [] History of DVT   [] Phlebitis   [x] Swelling in legs   [] Varicose veins   [x] Non-healing ulcers Pulmonary:   [] Uses home oxygen   [] Productive cough   [] Hemoptysis   [] Wheeze  [] COPD   [] Asthma Neurologic:  [] Dizziness  [] Blackouts   [] Seizures   [] History of stroke   [] History of TIA  [] Aphasia   [] Temporary blindness   [] Dysphagia   [] Weakness or numbness in arms   [] Weakness or numbness in legs Musculoskeletal:  [] Arthritis   [] Joint swelling   [] Joint pain   [] Low back pain Hematologic:  [] Easy bruising  [] Easy bleeding   [] Hypercoagulable state   [] Anemic  [] Hepatitis Gastrointestinal:  [] Blood in stool   [] Vomiting blood  [] Gastroesophageal reflux/heartburn   [] Difficulty swallowing. Genitourinary:  [] Chronic kidney  disease   [] Difficult urination  [] Frequent urination  [] Burning with urination   [] Blood in urine Skin:  [] Rashes   [x] Ulcers   [x] Wounds Psychological:  [] History of anxiety   []  History of major depression.  Physical Examination  Vitals:  04/10/16 1916 04/11/16 0021 04/11/16 0315 04/11/16 0809  BP: 129/86 104/69 122/79 (!) 143/82  Pulse: (!) 111 84 (!) 108 (!) 110  Resp: 19 19 19 20   Temp: 97.9 F (36.6 C) 97.6 F (36.4 C) 98.1 F (36.7 C)   TempSrc: Oral Oral Oral   SpO2: 100% 96% 96% 99%  Weight:      Height:       Body mass index is 23.78 kg/m. Gen:  WD/WN, NAD Head: /AT, No temporalis wasting. Prominent temp pulse not noted. Ear/Nose/Throat: Hearing Somewhat diminished, nares w/o erythema or drainage, oropharynx w/o Erythema/Exudate Eyes: PERRLA, EOMI.  Neck: Supple, no nuchal rigidity.  No JVD.  Pulmonary:  Good air movement, clear to auscultation bilaterally.No use of accessory muscles  Cardiac: Tachycardic and somewhat irregular Vascular:  Vessel Right Left  Radial Palpable Palpable  Ulnar Palpable Palpable  Brachial Palpable Palpable  Carotid Palpable, without bruit Palpable, without bruit  Aorta Not palpable N/A  Femoral Palpable Palpable  Popliteal Not Palpable Palpable  PT 1+ Palpable 1+ Palpable  DP Not Palpable Trace Palpable   Gastrointestinal: soft, non-tender/non-distended. No guarding/reflex. No masses, surgical incisions, or scars. Musculoskeletal: M/S 5/5 throughout.   No deformity or atrophy. 1-2+ right lower extremity edema. No left lower extremity edema Neurologic: CN 2-12 intact. Pain and light touch intact in extremities.  Symmetrical.  Speech is fluent. Motor exam as listed above. Psychiatric: Judgment intact, Mood & affect appropriate for pt's clinical situation. Dermatologic: Approximate 3 cm open ulceration on the base of the right foot laterally under the fifth metatarsal. Mild to moderate surrounding erythema. No purulent drainage  currently. Lymph : No Cervical, Axillary, or Inguinal lymphadenopathy.    CBC Lab Results  Component Value Date   WBC 9.4 04/10/2016   HGB 12.0 (L) 04/10/2016   HCT 35.7 (L) 04/10/2016   MCV 96.8 04/10/2016   PLT 134 (L) 04/10/2016    BMET    Component Value Date/Time   NA 139 04/11/2016 0358   NA 137 01/30/2013 0418   K 4.0 04/11/2016 0358   K 4.2 01/30/2013 0418   CL 109 04/11/2016 0358   CL 106 01/30/2013 0418   CO2 19 (L) 04/11/2016 0358   CO2 28 01/30/2013 0418   GLUCOSE 96 04/11/2016 0358   GLUCOSE 93 01/30/2013 0418   BUN 30 (H) 04/11/2016 0358   BUN 10 01/30/2013 0418   CREATININE 1.36 (H) 04/11/2016 0358   CREATININE 0.95 01/30/2013 0418   CALCIUM 8.3 (L) 04/11/2016 0358   CALCIUM 8.1 (L) 01/30/2013 0418   GFRNONAA 44 (L) 04/11/2016 0358   GFRNONAA >60 01/30/2013 0418   GFRAA 50 (L) 04/11/2016 0358   GFRAA >60 01/30/2013 0418   Estimated Creatinine Clearance: 34.7 mL/min (by C-G formula based on SCr of 1.36 mg/dL (H)).  COAG No results found for: INR, PROTIME  Radiology Dg Chest Port 1 View  Result Date: 04/12/2016 CLINICAL DATA:  Foot ulcer and pain, tachycardia EXAM: PORTABLE CHEST 1 VIEW COMPARISON:  01/19/2013 FINDINGS: Cardiac shadow is enlarged. Postsurgical changes are again seen. The lungs are well aerated bilaterally without focal infiltrate or sizable effusion. Postsurgical changes in the cervical spine are noted. No acute bony abnormality is seen. IMPRESSION: No active disease. Electronically Signed   By: Inez Catalina M.D.   On: 05/06/2016 17:37   Dg Foot Complete Right  Result Date: 04/11/2016 CLINICAL DATA:  Foot wound laterally with drainage, possible osteomyelitis EXAM: RIGHT FOOT COMPLETE - 3+ VIEW COMPARISON:  None. FINDINGS:  Hallux valgus deformity is noted with degenerative changes of the first MTP joint. An ulcer is noted adjacent to the head of the fifth metatarsal although no bony erosion to suggest osteomyelitis is seen. No acute  fracture or dislocation is seen. Vascular calcifications are noted. IMPRESSION: Chronic changes without acute abnormality. No findings to suggest osteomyelitis. Electronically Signed   By: Inez Catalina M.D.   On: 04/21/2016 17:38      Assessment/Plan 1. Sepsis secondary to osteomyelitis of the right foot. Improving on IV antibiotics. 2. Ulceration and infection of the right foot with suspected PAD. Discussed with the patient in detail with this is a critical and limb threatening situation. Discussed that without revascularization, limb loss as expected. Discussed the risks and benefits of angiography for further evaluation and potential treatment. Discussed that without revascularization, it is possible for the wound to heal but generally the wound feels very slowly if at all and there is a much higher risk of worsening ascending infection and limb loss. Given his age, a minimally invasive approach to his revascularization would certainly be preferred. I have put him on the schedule for Monday, October 9. Continue antibiotics until that time. 3. Diabetes. Optimize glucose control important not only for wound healing but avoiding progression of atherosclerotic disease. 4. History of colon cancer. Stable.   Leotis Pain, MD  04/11/2016 9:09 AM    This note was created with Dragon medical transcription system.  Any error is purely unintentional

## 2016-04-11 NOTE — Progress Notes (Signed)
Chaplain rounding visited patient to provided spiritual care and asked patient if need assistance with anything, patient said prayer would be sufficient. Chaplain provided prayers and presence.     04/11/16 1300  Clinical Encounter Type  Visited With Patient  Visit Type Initial;Spiritual support  Referral From Nurse  Spiritual Encounters  Spiritual Needs Prayer

## 2016-04-11 NOTE — Progress Notes (Signed)
Pharmacy Antibiotic Note  Troy Orr is a 82 y.o. male admitted on 05/05/2016 with Sepsis due to right foot osteomyelitis/cellulitis.  Pharmacy has been consulted for Vancomycin and Zosyn dosing. Patient received Vancomycin 1gm IV x1 and Zosyn 3.375 Iv x1 in ED.   Currently on Vancomycin 1g IV q24h and Zosyn 3.375g IV q8h EI. Patient had slight bump in SCr today from 1.20 to 1.36.  Plan: Will continue current antibiotic regimen. Will check a Vancomycin torugh level prior to 10/6 dose to assess for clearance. This will not be a steady state level. Will also check a SCr with 10/7 AM labs.  Height: 5\' 9"  (175.3 cm) Weight: 161 lb (73 kg) IBW/kg (Calculated) : 70.7  Temp (24hrs), Avg:97.8 F (36.6 C), Min:97.5 F (36.4 C), Max:98.1 F (36.7 C)   Recent Labs Lab 04/13/2016 1622 05/02/2016 1926 04/11/2016 2222 04/10/16 0428 04/11/16 0358  WBC 15.6*  --  11.8* 9.4  --   CREATININE 1.41*  --  1.18 1.20 1.36*  LATICACIDVEN 4.0* 2.0* 1.7  --   --     Estimated Creatinine Clearance: 34.7 mL/min (by C-G formula based on SCr of 1.36 mg/dL (H)).    No Known Allergies  Antimicrobials this admission: 10/4 Zosyn >>  10/4 Vancomycin >>   Dose adjustments this admission:  Microbiology results: 10/4 BCx: pending  10/4 Wound Cx: pending  10/4 UCx: pending 10/4 MRSA PCR:   Thank you for allowing pharmacy to be a part of this patient's care.  Paulina Fusi, PharmD, BCPS 04/11/2016 11:54 AM

## 2016-04-11 NOTE — Progress Notes (Addendum)
Pharmacy Antibiotic Note  Troy Orr is a 82 y.o. male admitted on 04/25/2016 with Sepsis due to right foot osteomyelitis/cellulitis.  Pharmacy has been consulted for Vancomycin and Zosyn dosing. Patient received Vancomycin 1gm IV x1 and Zosyn 3.375 Iv x1 in ED.   Currently on Vancomycin 1g IV q24h and Zosyn 3.375g IV q8h EI. Patient had slight bump in SCr today from 1.20 to 1.36. 10  10/7 AM SCr increased to 1.49. Will check level before next vancomycin level is due.   Plan: Will continue current antibiotic regimen. Will check a Vancomycin torugh level prior to 10/6 dose to assess for clearance. This will not be a steady state level. Will also check a SCr with 10/7 AM labs.  Height: 5\' 9"  (175.3 cm) Weight: 161 lb (73 kg) IBW/kg (Calculated) : 70.7  Temp (24hrs), Avg:98 F (36.7 C), Min:97.6 F (36.4 C), Max:98.2 F (36.8 C)   Recent Labs Lab 04/10/2016 1622 04/07/2016 1926 04/18/2016 2222 04/10/16 0428 04/11/16 0358 04/11/16 2209  WBC 15.6*  --  11.8* 9.4  --   --   CREATININE 1.41*  --  1.18 1.20 1.36*  --   LATICACIDVEN 4.0* 2.0* 1.7  --   --   --   VANCOTROUGH  --   --   --   --   --  19    Estimated Creatinine Clearance: 34.7 mL/min (by C-G formula based on SCr of 1.36 mg/dL (H)).    No Known Allergies  Antimicrobials this admission: 10/4 Zosyn >>  10/4 Vancomycin >>   Dose adjustments this admission: 10/6 PM vanc level 19. Continue current regimen for now. SCr pending with AM labs.   Microbiology results: 10/4 BCx: pending  10/4 Wound Cx: pending  10/4 UCx: pending 10/4 MRSA PCR:   Thank you for allowing pharmacy to be a part of this patient's care.  Paulina Fusi, PharmD, BCPS 04/11/2016 11:13 PM

## 2016-04-12 ENCOUNTER — Other Ambulatory Visit: Payer: Self-pay | Admitting: Radiology

## 2016-04-12 ENCOUNTER — Inpatient Hospital Stay: Payer: Medicare Other

## 2016-04-12 ENCOUNTER — Ambulatory Visit: Payer: Medicare Other

## 2016-04-12 LAB — GLUCOSE, CAPILLARY
GLUCOSE-CAPILLARY: 149 mg/dL — AB (ref 65–99)
GLUCOSE-CAPILLARY: 88 mg/dL (ref 65–99)
Glucose-Capillary: 100 mg/dL — ABNORMAL HIGH (ref 65–99)
Glucose-Capillary: 97 mg/dL (ref 65–99)
Glucose-Capillary: 144 mg/dL — ABNORMAL HIGH (ref 65–99)
Glucose-Capillary: 147 mg/dL — ABNORMAL HIGH (ref 65–99)

## 2016-04-12 LAB — CREATININE, SERUM
Creatinine, Ser: 1.49 mg/dL — ABNORMAL HIGH (ref 0.61–1.24)
GFR, EST AFRICAN AMERICAN: 45 mL/min — AB (ref >60)
GFR, EST NON AFRICAN AMERICAN: 39 mL/min — AB (ref >60)

## 2016-04-12 MED ORDER — VANCOMYCIN HCL IN DEXTROSE 1-5 GM/200ML-% IV SOLN
1000.0000 mg | INTRAVENOUS | Status: DC
  Administered 2016-04-13: 1000 mg via INTRAVENOUS
  Filled 2016-04-12 (×2): qty 200

## 2016-04-12 NOTE — Progress Notes (Signed)
Troy Orr NAME: Troy Orr    MR#:  [id number]  DATE OF BIRTH:  24-Oct-1921  SUBJECTIVE:  CHIEF COMPLAINT:   Chief Complaint  Patient presents with  . Foot Pain    Right      Came with pain and redness on right foot.   On broad spectrum Abx.   Feels same today.  had good BM yesterday after stool softner.  REVIEW OF SYSTEMS:  CONSTITUTIONAL: No fever, fatigue or weakness.  EYES: No blurred or double vision.  EARS, NOSE, AND THROAT: No tinnitus or ear pain.  RESPIRATORY: No cough, shortness of breath, wheezing or hemoptysis.  CARDIOVASCULAR: No chest pain, orthopnea, edema.  GASTROINTESTINAL: No nausea, vomiting, diarrhea or abdominal pain.  GENITOURINARY: No dysuria, hematuria.  ENDOCRINE: No polyuria, nocturia,  HEMATOLOGY: No anemia, easy bruising or bleeding SKIN: No rash or lesion. MUSCULOSKELETAL: No joint pain or arthritis.   NEUROLOGIC: No tingling, numbness, weakness.  PSYCHIATRY: No anxiety or depression.   ROS  DRUG ALLERGIES:  No Known Allergies  VITALS:  Blood pressure 131/81, pulse (!) 108, temperature 97.6 F (36.4 C), temperature source Oral, resp. rate (!) 22, height 5\' 9"  (1.753 m), weight 73 kg (161 lb), SpO2 95 %.  PHYSICAL EXAMINATION:   GENERAL:  82 y.o.-year-old patient lying in the bed with no acute distress.  EYES: Pupils equal, round, reactive to light and accommodation. No scleral icterus. Extraocular muscles intact.  HEENT: Head atraumatic, normocephalic. Oropharynx and nasopharynx clear.  NECK:  Supple, no jugular venous distention. No thyroid enlargement, no tenderness.  LUNGS: Normal breath sounds bilaterally, no wheezing, rales,rhonchi or crepitation. No use of accessory muscles of respiration.  CARDIOVASCULAR: S1, S2 normal. No murmurs, rubs, or gallops.  ABDOMEN: Soft, nontender, nondistended. Bowel sounds present. No organomegaly or mass.  EXTREMITIES: Right lower extremity  and pedal edema of approximately 1-2+, some Calf tenderness on palpation on the right, no cyanosis, or clubbing. Right fifth toe base has few ulcers, lateral and plantar surface, blackish wound base on plantar surface ulcer, minimal purulence on the lateral ulceration but no drainage, erythema extending to 2 lower thirds of the tibia, increased warmth, but no significant tenderness on palpation of the foot itself, tender calf muscles on the right significant skin scaling  NEUROLOGIC: Cranial nerves II through XII are intact. Muscle strength 4/5 in all extremities. Sensation diminished to light touch in distal lower extremities . Gait not checked.  PSYCHIATRIC: The patient is alert and oriented x 3.  SKIN: Right foot two ulcerations were noted at fifth toe base. Necrotic superficial skin over the area.  Physical Exam LABORATORY PANEL:   CBC  Recent Labs Lab 04/10/16 0428  WBC 9.4  HGB 12.0*  HCT 35.7*  PLT 134*   ------------------------------------------------------------------------------------------------------------------  Chemistries   Recent Labs Lab 04/20/2016 1622  04/11/16 0358 04/12/16 0345  NA 135  < > 139  --   K 4.6  < > 4.0  --   CL 102  < > 109  --   CO2 22  < > 19*  --   GLUCOSE 182*  < > 96  --   BUN 35*  < > 30*  --   CREATININE 1.41*  < > 1.36* 1.49*  CALCIUM 9.0  < > 8.3*  --   AST 25  --   --   --   ALT 20  --   --   --   ALKPHOS 79  --   --   --  BILITOT 0.4  --   --   --   < > = values in this interval not displayed. ------------------------------------------------------------------------------------------------------------------  Cardiac Enzymes  Recent Labs Lab 04/10/16 0428 04/10/16 1006  TROPONINI 0.15* 0.20*   ------------------------------------------------------------------------------------------------------------------  RADIOLOGY:  No results found.  ASSESSMENT AND PLAN:   Active Problems:   Sepsis ([place])   Osteomyelitis of right  foot ([facility])   Acute renal insufficiency   Leukocytosis   Pressure injury of skin   #1. Sepsis due to right foot osteomyelitis/cellulitis, admit patient to medical floor, blood cultures are taken, patient is initiated on broad-spectrum antibiotic therapy with Zosyn and vancomycin,   evaluated by podiatrist for likely 5th metatarsal ray resection, on Sunday.   Also seen by vascular surgery- plan for angioplasty on Monday. #2. Osteomyelitis/cellulitis of right, continue broad-spectrum antibody therapy, get an MRSA PCR #3. Acute renal insufficiency, continue IV fluids, follow creatinine in the morning   As he will need vascular procedure, will keep well hydrated. Slightly worsening in creatinine. #4. Leukocytosis, follow with antibiotic therapy #5 Lactic acidosis, follow lactic acid level closely. Continue IV fluids, supportive therapy.  #6 sinus tachycardia, due to lactic acidosis, normal TSH, follow cardiac enzymes 3, continue metoprolol   All the records are reviewed and case discussed with Care Management/Social Workerr. Management plans discussed with the patient, family and they are in agreement.  CODE STATUS: full  TOTAL TIME TAKING CARE OF THIS PATIENT: 35 minutes.   POSSIBLE D/C IN 4-5  DAYS, DEPENDING ON CLINICAL CONDITION.   Troy Orr M.D on 04/12/2016   Between 7am to 6pm - Pager - [phone]  After 6pm go to [email or web address] - password [email or web address] [facility]  Office  [phone]  CC: Primary care physician; Troy Crouch, MD  Note: This dictation was prepared with Dragon dictation along with smaller phrase technology. Any transcriptional errors that result from this process are unintentional.

## 2016-04-12 NOTE — Progress Notes (Signed)
Patient Demographics  Troy Orr, is a 82 y.o. male   MRN: [id number]   DOB - 10-Jan-1922  Admit Date - 04/22/2016    Outpatient Primary MD for the patient is SPARKS,JEFFREY D, MD  Consult requested in the Hospital by Troy Basta, MD, On 04/12/2016     With History of -  Past Medical History:  Diagnosis Date  . Arthritis   . Cancer [facility])    Colon   . Diabetes ([place])   . Heart trouble       Past Surgical History:  Procedure Laterality Date  . heart bypass      in for   Chief Complaint  Patient presents with  . Foot Pain    Right     HPI  Troy Orr  is a 82 y.o. male, Patient's has had a chronic ulcerative change to the left foot. He states he's had ulcerations periodically in the right foot and he has been treated outpatient at [facility]. He was sent to the emergency room because of infection on Friday. This was October 6. stems were negative.   Social History Social History  Substance Use Topics  . Smoking status: Never Smoker  . Smokeless tobacco: Never Used  . Alcohol use No     Family History History reviewed. No pertinent family history.   Prior to Admission medications   Medication Sig Start Date End Date Taking? Authorizing Provider  acetaminophen (TYLENOL) 325 MG tablet Take 650 mg by mouth every 6 (six) hours as needed.   Yes Historical Provider, MD  aspirin 81 MG tablet Take 81 mg by mouth daily.   Yes Historical Provider, MD  Calcium Carbonate Antacid (TUMS E-X SUGAR FREE PO) Take 1 tablet by mouth every 4 (four) hours as needed.   Yes Historical Provider, MD  carbamide peroxide (DEBROX) 6.5 % otic solution Place 5 drops into both ears 2 (two) times daily as needed.   Yes Historical Provider, MD  diphenoxylate-atropine (LOMOTIL) 2.5-0.025 MG tablet  Take 1 tablet by mouth as needed for diarrhea or loose stools.   Yes Historical Provider, MD  guaifenesin (DIABETIC TUSSIN EX) 100 MG/5ML syrup Take 200 mg by mouth every 4 (four) hours as needed for cough.   Yes Historical Provider, MD  hydrOXYzine (ATARAX/VISTARIL) 25 MG tablet Take 25 mg by mouth every 6 (six) hours as needed.   Yes Historical Provider, MD  metFORMIN (GLUCOPHAGE) 1000 MG tablet Take 500 mg by mouth 3 (three) times daily.    Yes Historical Provider, MD  metoprolol succinate (TOPROL-XL) 50 MG 24 hr tablet  11/16/13  Yes Historical Provider, MD  Multiple Vitamin (DAILY VITE) TABS Take 1 tablet by mouth daily.   Yes Historical Provider, MD  omeprazole (PRILOSEC) 20 MG capsule Take 20 mg by mouth daily.   Yes Historical Provider, MD  ondansetron (ZOFRAN) 4 MG tablet Take 4 mg by mouth every 8 (eight) hours as needed for nausea or vomiting.   Yes Historical Provider, MD  polycarbophil (FIBERCON) 625 MG tablet Take 625 mg by mouth 2 (two) times daily.   Yes Historical Provider, MD  polyethylene glycol (MIRALAX / GLYCOLAX) packet Take 17 g by mouth  daily.   Yes Historical Provider, MD  simethicone (MYLICON) 0000000 MG chewable tablet Chew 125 mg by mouth 4 (four) times daily -  before meals and at bedtime.   Yes Historical Provider, MD  torsemide (DEMADEX) 20 MG tablet Take 20 mg by mouth once.  11/30/13  Yes Historical Provider, MD  traZODone (DESYREL) 50 MG tablet Take 50 mg by mouth at bedtime.   Yes Historical Provider, MD  vitamin B-12 (CYANOCOBALAMIN) 1000 MCG tablet Take 1,000 mcg by mouth daily.   Yes Historical Provider, MD    Anti-infectives    Start     Dose/Rate Route Frequency Ordered Stop   04/10/16 0000  vancomycin (VANCOCIN) IVPB 1000 mg/200 mL premix     1,000 mg 200 mL/hr over 60 Minutes Intravenous Every 24 hours 04/12/2016 1926     04/27/2016 1924  piperacillin-tazobactam (ZOSYN) IVPB 3.375 g     3.375 g 12.5 mL/hr over 240 Minutes Intravenous Every 8 hours 04/23/2016 1926      04/08/2016 1700  piperacillin-tazobactam (ZOSYN) IVPB 3.375 g     3.375 g 100 mL/hr over 30 Minutes Intravenous  Once 05/02/2016 1649 04/08/2016 1737   05/06/2016 1700  vancomycin (VANCOCIN) IVPB 1000 mg/200 mL premix     1,000 mg 200 mL/hr over 60 Minutes Intravenous  Once 04/24/2016 1649 05/01/2016 1807      Scheduled Meds: . amLODipine  5 mg Oral Daily  . aspirin EC  81 mg Oral Daily  . Chlorhexidine Gluconate Cloth  6 each Topical Once  . docusate sodium  100 mg Oral BID  . enoxaparin (LOVENOX) injection  40 mg Subcutaneous Q24H  . insulin aspart  0-5 Units Subcutaneous QHS  . insulin aspart  0-9 Units Subcutaneous TID WC  . insulin aspart  4 Units Subcutaneous TID WC  . linagliptin  5 mg Oral Daily  . metoprolol  50 mg Oral BID  . pantoprazole  40 mg Oral Daily  . piperacillin-tazobactam (ZOSYN)  IV  3.375 g Intravenous Q8H  . polycarbophil  625 mg Oral BID  . polyethylene glycol  17 g Oral Daily  . senna  1 tablet Oral Daily  . simethicone  80 mg Oral QID  . simvastatin  40 mg Oral Daily  . sodium chloride flush  3 mL Intravenous Q12H  . traZODone  50 mg Oral QHS  . vancomycin  1,000 mg Intravenous Q24H  . vitamin B-12  1,000 mcg Oral Daily   Continuous Infusions: . sodium chloride    . sodium chloride 75 mL/hr at 04/11/16 1519   PRN Meds:.acetaminophen **OR** acetaminophen, HYDROcodone-acetaminophen, meclizine, ondansetron **OR** ondansetron (ZOFRAN) IV  No Known Allergies  Physical Exam  Vitals  Blood pressure 132/76, pulse (!) 108, temperature 97.6 F (36.4 C), temperature source Oral, resp. rate 16, height 5\' 9"  (1.753 m), weight 73 kg (161 lb), SpO2 98 %.  Lower Extremity exam:Right foot is little more stable than yesterday. No a lot of progressive redness or cellulitis. Wounds are present down at the fifth metatarsal both laterally and plantarly. Small island of skin incision between the posterior is not sure how viable evidence. Toward fifth toe is not bad but due  to the severity of the ulcerations there is likely osteomyelitis to the fifth metatarsal head as well as the fifth toe. Debridement at bedside yesterday yielded frank pus emanating from the area with probing down at least to the joint capsule was brought to the bone. Basket status is questionable and is scheduled for  an angioplasty on Monday x-rays showed small vessel calcification.  Data Review  CBC  Recent Labs Lab 05/06/2016 1622 04/27/2016 2222 04/10/16 0428  WBC 15.6* 11.8* 9.4  HGB 13.2 12.2* 12.0*  HCT 39.4* 37.1* 35.7*  PLT 164 137* 134*  MCV 96.8 96.4 96.8  MCH 32.4 31.8 32.7  MCHC 33.5 33.0 33.7  RDW 15.6* 15.5* 15.6*  LYMPHSABS 1.5  --   --   MONOABS 1.2*  --   --   EOSABS 0.0  --   --   BASOSABS 0.1  --   --    ------------------------------------------------------------------------------------------------------------------  Chemistries   Recent Labs Lab 04/26/2016 1622 04/08/2016 2222 04/10/16 0428 04/11/16 0358 04/12/16 0345  NA 135  --  138 139  --   K 4.6  --  4.0 4.0  --   CL 102  --  109 109  --   CO2 22  --  23 19*  --   GLUCOSE 182*  --  191* 96  --   BUN 35*  --  32* 30*  --   CREATININE 1.41* 1.18 1.20 1.36* 1.49*  CALCIUM 9.0  --  8.0* 8.3*  --   AST 25  --   --   --   --   ALT 20  --   --   --   --   ALKPHOS 79  --   --   --   --   BILITOT 0.4  --   --   --   --    ----------------------------------------------------------------------------  Assessment & Plan: Overall is for the stabilizing but he has likely osteomyelitis to the fifth metatarsal head and possibly the base of the proximal phalanx. Lab results are all improving. White count is back down to a normal level at this point. Plan: We'll get an MRI on him today and check those results later this evening. Keep him nothing by mouth after midnight tonight with plan on probable fifth ray amputation to the right foot tomorrow morning. We'll evaluate MRI for assessment prior to that. I have spoken  with Dr. Lucky Cowboy and he concurs that this is a necessary process prior to his angioplasty.  Active Problems:   Sepsis ([place])   Osteomyelitis of right foot ([facility])   Acute renal insufficiency   Leukocytosis   Pressure injury of skin   Family Communication: Plan discussed with patient    Perry Mount M.D on 04/12/2016 at 8:50 AM  Thank you for the consult, we will follow the patient with you in the Hospital.

## 2016-04-12 NOTE — Progress Notes (Signed)
Pt. Had 3 beat run of v-tach. Dr. Marcille Blanco notified and no new orders received.

## 2016-04-12 NOTE — Progress Notes (Signed)
Pharmacy Antibiotic Note  Troy Orr is a 82 y.o. male admitted on 05/02/2016 with Sepsis due to right foot osteomyelitis/cellulitis.  Pharmacy has been consulted for Vancomycin and Zosyn dosing. Patient received Vancomycin 1gm IV x1 and Zosyn 3.375 Iv x1 in ED.   Currently on Vancomycin 1g IV q24h and Zosyn 3.375g IV q8h EI. Patient had slight bump in SCr again today. SCr 1.20>>1.36>>1.49  10/6 PM Vanc trough 82mcg/ml (not a steady state level)  Plan: Since Vanc trough was not a steady state level and SCr is trending up will decrease dose to Vancomycin 1g IV q36h and recheck SCr with AM labs.  Height: 5\' 9"  (175.3 cm) Weight: 161 lb (73 kg) IBW/kg (Calculated) : 70.7  Temp (24hrs), Avg:97.9 F (36.6 C), Min:97.5 F (36.4 C), Max:98.2 F (36.8 C)   Recent Labs Lab 04/16/2016 1622 05/04/2016 1926 04/11/2016 2222 04/10/16 0428 04/11/16 0358 04/11/16 2209 04/12/16 0345  WBC 15.6*  --  11.8* 9.4  --   --   --   CREATININE 1.41*  --  1.18 1.20 1.36*  --  1.49*  LATICACIDVEN 4.0* 2.0* 1.7  --   --   --   --   VANCOTROUGH  --   --   --   --   --  19  --     Estimated Creatinine Clearance: 31.6 mL/min (by C-G formula based on SCr of 1.49 mg/dL (H)).    No Known Allergies  Antimicrobials this admission: 10/4 Zosyn >>  10/4 Vancomycin >>   Dose adjustments this admission: 10/7 Decrease Vancomycin to 1g IV q36h   Microbiology results: 10/4 BCx: pending  10/4 Wound Cx: pending  10/4 UCx: pending 10/4 MRSA PCR:   Thank you for allowing pharmacy to be a part of this patient's care.  Paulina Fusi, PharmD, BCPS 04/12/2016 12:32 PM

## 2016-04-13 ENCOUNTER — Inpatient Hospital Stay: Payer: Medicare Other | Admitting: Anesthesiology

## 2016-04-13 ENCOUNTER — Encounter: Admission: EM | Disposition: E | Payer: Self-pay | Source: Home / Self Care | Attending: Internal Medicine

## 2016-04-13 ENCOUNTER — Inpatient Hospital Stay: Payer: Medicare Other

## 2016-04-13 ENCOUNTER — Encounter: Payer: Self-pay | Admitting: Podiatry

## 2016-04-13 DIAGNOSIS — N289 Disorder of kidney and ureter, unspecified: Secondary | ICD-10-CM

## 2016-04-13 DIAGNOSIS — I469 Cardiac arrest, cause unspecified: Secondary | ICD-10-CM | POA: Diagnosis present

## 2016-04-13 DIAGNOSIS — I70238 Atherosclerosis of native arteries of right leg with ulceration of other part of lower right leg: Secondary | ICD-10-CM

## 2016-04-13 DIAGNOSIS — M86071 Acute hematogenous osteomyelitis, right ankle and foot: Secondary | ICD-10-CM

## 2016-04-13 HISTORY — PX: AMPUTATION TOE: SHX6595

## 2016-04-13 LAB — BLOOD GAS, ARTERIAL
Acid-base deficit: 16 mmol/L — ABNORMAL HIGH (ref 0.0–2.0)
Bicarbonate: 10.7 mmol/L — ABNORMAL LOW (ref 20.0–28.0)
FIO2: 1
O2 SAT: 99.9 %
PATIENT TEMPERATURE: 37
PCO2 ART: 28 mmHg — AB (ref 32.0–48.0)
PEEP: 12 cmH2O
PO2 ART: 384 mmHg — AB (ref 83.0–108.0)
RATE: 16 resp/min
VT: 550 mL
pH, Arterial: 7.19 — CL (ref 7.350–7.450)

## 2016-04-13 LAB — TROPONIN I: TROPONIN I: 0.13 ng/mL — AB (ref <0.03)

## 2016-04-13 LAB — CBC
HCT: 43.6 % (ref 40.0–52.0)
HEMOGLOBIN: 13.8 g/dL (ref 13.0–18.0)
MCH: 31.9 pg (ref 26.0–34.0)
MCHC: 31.6 g/dL — ABNORMAL LOW (ref 32.0–36.0)
MCV: 101.1 fL — ABNORMAL HIGH (ref 80.0–100.0)
PLATELETS: 175 10*3/uL (ref 150–440)
RBC: 4.32 MIL/uL — AB (ref 4.40–5.90)
RDW: 16.3 % — ABNORMAL HIGH (ref 11.5–14.5)
WBC: 26 10*3/uL — AB (ref 3.8–10.6)

## 2016-04-13 LAB — BASIC METABOLIC PANEL
Anion gap: 10 (ref 5–15)
Anion gap: 15 (ref 5–15)
BUN: 39 mg/dL — AB (ref 6–20)
BUN: 39 mg/dL — ABNORMAL HIGH (ref 6–20)
CALCIUM: 8.3 mg/dL — AB (ref 8.9–10.3)
CHLORIDE: 109 mmol/L (ref 101–111)
CHLORIDE: 109 mmol/L (ref 101–111)
CO2: 20 mmol/L — ABNORMAL LOW (ref 22–32)
CO2: 15 mmol/L — ABNORMAL LOW (ref 22–32)
CREATININE: 1.67 mg/dL — AB (ref 0.61–1.24)
Calcium: 8.2 mg/dL — ABNORMAL LOW (ref 8.9–10.3)
Creatinine, Ser: 1.73 mg/dL — ABNORMAL HIGH (ref 0.61–1.24)
GFR calc non Af Amer: 34 mL/min — ABNORMAL LOW (ref >60)
GFR, EST AFRICAN AMERICAN: 39 mL/min — AB (ref >60)
GFR, EST AFRICAN AMERICAN: 38 mL/min — AB (ref >60)
GFR, EST NON AFRICAN AMERICAN: 33 mL/min — AB (ref >60)
Glucose, Bld: 134 mg/dL — ABNORMAL HIGH (ref 65–99)
Glucose, Bld: 111 mg/dL — ABNORMAL HIGH (ref 65–99)
POTASSIUM: 4.4 mmol/L (ref 3.5–5.1)
Potassium: 4.2 mmol/L (ref 3.5–5.1)
SODIUM: 139 mmol/L (ref 135–145)
SODIUM: 139 mmol/L (ref 135–145)

## 2016-04-13 LAB — LACTIC ACID, PLASMA
LACTIC ACID, VENOUS: 5.4 mmol/L — AB (ref 0.5–1.9)
Lactic Acid, Venous: 5.7 mmol/L (ref 0.5–1.9)
Lactic Acid, Venous: 3.1 mmol/L (ref 0.5–1.9)

## 2016-04-13 LAB — GLUCOSE, CAPILLARY
GLUCOSE-CAPILLARY: 116 mg/dL — AB (ref 65–99)
GLUCOSE-CAPILLARY: 142 mg/dL — AB (ref 65–99)
GLUCOSE-CAPILLARY: 172 mg/dL — AB (ref 65–99)
Glucose-Capillary: 123 mg/dL — ABNORMAL HIGH (ref 65–99)

## 2016-04-13 LAB — SURGICAL PCR SCREEN
MRSA, PCR: NEGATIVE
STAPHYLOCOCCUS AUREUS: NEGATIVE

## 2016-04-13 SURGERY — AMPUTATION, TOE
Anesthesia: Monitor Anesthesia Care | Laterality: Right

## 2016-04-13 MED ORDER — NOREPINEPHRINE BITARTRATE 1 MG/ML IV SOLN
0.0000 ug/min | INTRAVENOUS | Status: DC
  Administered 2016-04-13: 5 ug/min via INTRAVENOUS
  Filled 2016-04-13 (×2): qty 16

## 2016-04-13 MED ORDER — SODIUM CHLORIDE 0.9% FLUSH: 10.0000 mL | INTRAVENOUS | Status: DC | PRN

## 2016-04-13 MED ORDER — DEXTROSE 5 % IV SOLN
INTRAVENOUS | Status: DC | PRN
  Administered 2016-04-13: 10 ug/min via INTRAVENOUS

## 2016-04-13 MED ORDER — FAMOTIDINE IN NACL 20-0.9 MG/50ML-% IV SOLN: 20.0000 mg | Freq: Two times a day (BID) | INTRAVENOUS | Status: DC

## 2016-04-13 MED ORDER — GENTAMICIN SULFATE 40 MG/ML IJ SOLN
INTRAMUSCULAR | Status: AC
  Filled 2016-04-13: qty 6

## 2016-04-13 MED ORDER — SODIUM CHLORIDE 0.9 % IV SOLN: 250.0000 mL | INTRAVENOUS | Status: DC | PRN

## 2016-04-13 MED ORDER — EPHEDRINE SULFATE 50 MG/ML IJ SOLN
INTRAMUSCULAR | Status: DC | PRN
  Administered 2016-04-13: 20 mg via INTRAVENOUS

## 2016-04-13 MED ORDER — GLYCOPYRROLATE 0.2 MG/ML IJ SOLN
INTRAMUSCULAR | Status: DC | PRN
  Administered 2016-04-13: 0.2 mg via INTRAVENOUS

## 2016-04-13 MED ORDER — ONDANSETRON HCL 4 MG/2ML IJ SOLN: 4.0000 mg | Freq: Once | INTRAMUSCULAR | Status: DC | PRN

## 2016-04-13 MED ORDER — ASPIRIN 81 MG PO CHEW: 81.0000 mg | CHEWABLE_TABLET | Freq: Every day | ORAL | Status: DC

## 2016-04-13 MED ORDER — SODIUM CHLORIDE 0.9% FLUSH: 3.0000 mL | INTRAVENOUS | Status: DC | PRN

## 2016-04-13 MED ORDER — NOREPINEPHRINE BITARTRATE 1 MG/ML IV SOLN
INTRAVENOUS | Status: AC
  Filled 2016-04-13: qty 8

## 2016-04-13 MED ORDER — SODIUM CHLORIDE 0.9 % IV BOLUS (SEPSIS)
500.0000 mL | Freq: Once | INTRAVENOUS | Status: AC
  Administered 2016-04-13: 500 mL via INTRAVENOUS

## 2016-04-13 MED ORDER — PHENYLEPHRINE HCL 10 MG/ML IJ SOLN
INTRAMUSCULAR | Status: DC | PRN
  Administered 2016-04-13: 300 ug via INTRAVENOUS
  Administered 2016-04-13: 400 ug via INTRAVENOUS
  Administered 2016-04-13: 200 ug via INTRAVENOUS

## 2016-04-13 MED ORDER — BUPIVACAINE HCL (PF) 0.5 % IJ SOLN
INTRAMUSCULAR | Status: AC
  Filled 2016-04-13: qty 30

## 2016-04-13 MED ORDER — SODIUM CHLORIDE 0.9% FLUSH
10.0000 mL | Freq: Two times a day (BID) | INTRAVENOUS | Status: DC
  Administered 2016-04-13 – 2016-04-15 (×4): 10 mL

## 2016-04-13 MED ORDER — ATROPINE SULFATE 0.4 MG/ML IJ SOLN
INTRAMUSCULAR | Status: DC | PRN
  Administered 2016-04-13: .8 mg via INTRAVENOUS

## 2016-04-13 MED ORDER — PROPOFOL 10 MG/ML IV BOLUS
INTRAVENOUS | Status: DC | PRN
  Administered 2016-04-13: 20 mg via INTRAVENOUS

## 2016-04-13 MED ORDER — FENTANYL CITRATE (PF) 100 MCG/2ML IJ SOLN
INTRAMUSCULAR | Status: DC | PRN
  Administered 2016-04-13: 25 ug via INTRAVENOUS

## 2016-04-13 MED ORDER — ENOXAPARIN SODIUM 30 MG/0.3ML ~~LOC~~ SOLN: 30.0000 mg | SUBCUTANEOUS | Status: DC

## 2016-04-13 MED ORDER — ORAL CARE MOUTH RINSE
15.0000 mL | OROMUCOSAL | Status: DC
  Administered 2016-04-13 – 2016-04-15 (×18): 15 mL via OROMUCOSAL

## 2016-04-13 MED ORDER — SODIUM CHLORIDE 0.9% FLUSH
3.0000 mL | Freq: Two times a day (BID) | INTRAVENOUS | Status: DC
  Administered 2016-04-13 – 2016-04-15 (×5): 3 mL via INTRAVENOUS

## 2016-04-13 MED ORDER — ONDANSETRON HCL 4 MG/2ML IJ SOLN: 4.0000 mg | Freq: Four times a day (QID) | INTRAMUSCULAR | Status: DC | PRN

## 2016-04-13 MED ORDER — HEPARIN SODIUM (PORCINE) 5000 UNIT/ML IJ SOLN
5000.0000 [IU] | Freq: Three times a day (TID) | INTRAMUSCULAR | Status: DC
  Administered 2016-04-13 – 2016-04-15 (×7): 5000 [IU] via SUBCUTANEOUS
  Filled 2016-04-13 (×7): qty 1

## 2016-04-13 MED ORDER — FENTANYL CITRATE (PF) 100 MCG/2ML IJ SOLN: 25.0000 ug | INTRAMUSCULAR | Status: DC | PRN

## 2016-04-13 MED ORDER — FAMOTIDINE IN NACL 20-0.9 MG/50ML-% IV SOLN
20.0000 mg | Freq: Two times a day (BID) | INTRAVENOUS | Status: DC
  Administered 2016-04-13 (×2): 20 mg via INTRAVENOUS
  Filled 2016-04-13 (×2): qty 50

## 2016-04-13 MED ORDER — PROPOFOL 10 MG/ML IV BOLUS
INTRAVENOUS | Status: DC | PRN
Start: 2016-04-13 — End: 2016-04-13

## 2016-04-13 MED ORDER — ACETAMINOPHEN 325 MG PO TABS: 650.0000 mg | ORAL_TABLET | ORAL | Status: DC | PRN

## 2016-04-13 MED ORDER — VANCOMYCIN HCL 1000 MG IV SOLR
INTRAVENOUS | Status: AC
  Filled 2016-04-13: qty 1000

## 2016-04-13 MED ORDER — PROPOFOL 500 MG/50ML IV EMUL
INTRAVENOUS | Status: DC | PRN
  Administered 2016-04-13: 45 ug/kg/min via INTRAVENOUS

## 2016-04-13 MED ORDER — EPINEPHRINE HCL 0.1 MG/ML IJ SOSY
PREFILLED_SYRINGE | INTRAMUSCULAR | Status: DC | PRN
  Administered 2016-04-13: .02 mg via INTRAVENOUS
  Administered 2016-04-13 (×2): 1 mg via INTRAVENOUS
  Administered 2016-04-13: 0.1 mg via INTRAVENOUS

## 2016-04-13 MED ORDER — CHLORHEXIDINE GLUCONATE 0.12% ORAL RINSE (MEDLINE KIT)
15.0000 mL | Freq: Two times a day (BID) | OROMUCOSAL | Status: DC
  Administered 2016-04-13 – 2016-04-15 (×4): 15 mL via OROMUCOSAL

## 2016-04-13 MED ORDER — LIDOCAINE HCL (PF) 1 % IJ SOLN
INTRAMUSCULAR | Status: AC
  Filled 2016-04-13: qty 30

## 2016-04-13 MED FILL — Medication: Qty: 1 | Status: AC

## 2016-04-13 SURGICAL SUPPLY — 37 items
BANDAGE ELASTIC 4 LF NS (GAUZE/BANDAGES/DRESSINGS) IMPLANT
BANDAGE STRETCH 3X4.1 STRL (GAUZE/BANDAGES/DRESSINGS) ×3 IMPLANT
BLADE MED AGGRESSIVE (BLADE) ×3 IMPLANT
BLADE SURG 15 STRL LF DISP TIS (BLADE) ×4 IMPLANT
BLADE SURG 15 STRL SS (BLADE) ×8
BNDG ESMARK 4X12 TAN STRL LF (GAUZE/BANDAGES/DRESSINGS) ×3 IMPLANT
BNDG GAUZE 4.5X4.1 6PLY STRL (MISCELLANEOUS) ×3 IMPLANT
CANISTER SUCT 1200ML W/VALVE (MISCELLANEOUS) ×3 IMPLANT
CUFF TOURN 18 STER (MISCELLANEOUS) ×3 IMPLANT
CUFF TOURN DUAL PL 12 NO SLV (MISCELLANEOUS) ×3 IMPLANT
DRAPE FLUOR MINI C-ARM 54X84 (DRAPES) ×3 IMPLANT
DRSG MEPITEL 4X7.2 (GAUZE/BANDAGES/DRESSINGS) ×3 IMPLANT
DURAPREP 26ML APPLICATOR (WOUND CARE) ×3 IMPLANT
ELECT REM PT RETURN 9FT ADLT (ELECTROSURGICAL) ×3
ELECTRODE REM PT RTRN 9FT ADLT (ELECTROSURGICAL) ×1 IMPLANT
GAUZE PETRO XEROFOAM 1X8 (MISCELLANEOUS) ×3 IMPLANT
GAUZE SPONGE 4X4 12PLY STRL (GAUZE/BANDAGES/DRESSINGS) ×3 IMPLANT
GLOVE BIO SURGEON STRL SZ8 (GLOVE) ×9 IMPLANT
GLOVE INDICATOR 7.5 STRL GRN (GLOVE) ×3 IMPLANT
GOWN STRL REUS W/ TWL LRG LVL3 (GOWN DISPOSABLE) ×2 IMPLANT
GOWN STRL REUS W/TWL LRG LVL3 (GOWN DISPOSABLE) ×4
KIT RM TURNOVER STRD PROC AR (KITS) ×3 IMPLANT
KIT STIMULAN RAPID CURE 5CC (Orthopedic Implant) ×3 IMPLANT
LABEL OR SOLS (LABEL) ×3 IMPLANT
NDL SAFETY 18GX1.5 (NEEDLE) ×3 IMPLANT
NEEDLE HYPO 25X1 1.5 SAFETY (NEEDLE) ×9 IMPLANT
NS IRRIG 500ML POUR BTL (IV SOLUTION) ×3 IMPLANT
PACK EXTREMITY ARMC (MISCELLANEOUS) ×3 IMPLANT
PAD NEG PRESSURE SENSATRAC (MISCELLANEOUS) ×3 IMPLANT
PENCIL ELECTRO HAND CTR (MISCELLANEOUS) IMPLANT
RASP SM TEAR CROSS CUT (RASP) ×3 IMPLANT
STOCKINETTE STRL 6IN 960660 (GAUZE/BANDAGES/DRESSINGS) ×3 IMPLANT
SUT ETH BLK MONO 3 0 FS 1 12/B (SUTURE) ×3 IMPLANT
SUT ETHILON 5 0 PS 2 18 (SUTURE) ×3 IMPLANT
SUT VIC AB 4-0 FS2 27 (SUTURE) ×3 IMPLANT
SWAB CULTURE AMIES ANAERIB BLU (MISCELLANEOUS) ×3 IMPLANT
SYRINGE 10CC LL (SYRINGE) ×3 IMPLANT

## 2016-04-13 NOTE — Addendum Note (Signed)
Addendum  created 04/07/2016 1407 by Alvin Critchley, MD   Anesthesia Event edited

## 2016-04-13 NOTE — Anesthesia Preprocedure Evaluation (Addendum)
Anesthesia Evaluation  Patient identified by MRN, date of birth, ID band Patient awake    Reviewed: Allergy & Precautions, NPO status , Patient's Chart, lab work & pertinent test results, reviewed documented beta blocker date and time   Airway Mallampati: III  TM Distance: <3 FB     Dental   Pulmonary neg pulmonary ROS,    Pulmonary exam normal        Cardiovascular hypertension, Pt. on medications and Pt. on home beta blockers + CAD  Normal cardiovascular exam     Neuro/Psych Hearing deficit negative psych ROS   GI/Hepatic negative GI ROS, Neg liver ROS,   Endo/Other  diabetes, Well Controlled, Type 2, Oral Hypoglycemic Agents  Renal/GU Renal InsufficiencyRenal disease  negative genitourinary   Musculoskeletal  (+) Arthritis , Osteoarthritis,  osteomyelitis   Abdominal Normal abdominal exam  (+)   Peds negative pediatric ROS (+)  Hematology   Anesthesia Other Findings   Reproductive/Obstetrics                             Anesthesia Physical Anesthesia Plan  ASA: III and emergent  Anesthesia Plan: MAC   Post-op Pain Management:    Induction: Intravenous  Airway Management Planned: Nasal Cannula  Additional Equipment:   Intra-op Plan:   Post-operative Plan:   Informed Consent: I have reviewed the patients History and Physical, chart, labs and discussed the procedure including the risks, benefits and alternatives for the proposed anesthesia with the patient or authorized representative who has indicated his/her understanding and acceptance.   Dental advisory given  Plan Discussed with: CRNA and Surgeon  Anesthesia Plan Comments:        Anesthesia Quick Evaluation

## 2016-04-13 NOTE — OR Nursing (Signed)
Responded to code blue in OR 8

## 2016-04-13 NOTE — Anesthesia Postprocedure Evaluation (Signed)
Anesthesia Post Note  Patient: Jac Booton Drinkard  Procedure(s) Performed: Procedure(s) (LRB): AMPUTATION TOE (Right)  Patient location during evaluation: ICU Anesthesia Type: MAC Level of consciousness: patient remains intubated per anesthesia plan Pain management: pain level controlled Vital Signs Assessment: vitals unstable Respiratory status: patient remains intubated per anesthesia plan and patient on ventilator - see flowsheet for VS Cardiovascular status: unstable Comments: 82 year old patient with osteomyelitis of foot and apparent sepsis/lactic acidosis for resection of infected tissue under MAC anesthesia.  Patient had only mild sedation and was talking initially through initial part of procedure, but , nevertheless started to drop his pressure  first and then became bradycardic with resistant effects of pressor agents.  A full code was called and the patient was given 2 rounds of epi, atropine, full CPR and was tubed.  Amiodarone 150 mg given IV with the return of BP with a consistent pulse.  An amp of bicarb was also given.  The patient was initiated on a levophed drip.  The patient was then transported to the ICU with monitors.      I suspect that the patient had significant sepsis/ lactic acidosis which made him markedly prone to arrhythmias and cardiac decompensation, in retrospect.  The patient also noted that he could not lay down flat and was in a semisitting position.    Last Vitals:  Vitals:   05/06/2016 1040 04/06/2016 1050  BP: 98/60 109/77  Pulse: 75 75  Resp:    Temp:      Last Pain:  Vitals:   04/10/2016 0733  TempSrc: Oral  PainSc:                  Maxemiliano Riel

## 2016-04-13 NOTE — Progress Notes (Signed)
RN made Dr. Ronnette Juniper aware that patient's lactic acid is 5.7 and that patient has only had 5cc of urine output since coming to ICU from OR this morning.  Foley irrigated and bladder scan results were 15cc.  MD gave order for 500cc bolus and to obtain CVP.

## 2016-04-13 NOTE — Progress Notes (Signed)
RN made Dr. Mortimer Fries aware of troponin of 0.13 and lactic acid of 5.4, MD acknowledged and gave no new orders regarding labs.  RN asked MD about central line verification and asked about if RN needs to advance OG tube.  Dr. Mortimer Fries stated "you can use the central line and dont advance OG tube and discontinue all of his oral meds."

## 2016-04-13 NOTE — Progress Notes (Signed)
Anticoagulation monitoring(Lovenox):  82yo  male ordered Lovenox 40 mg Q24h for DVT prevention.  Filed Weights   04/28/2016 1619  Weight: 161 lb (73 kg)     Lab Results  Component Value Date   CREATININE 1.67 (H) 04/20/2016   CREATININE 1.49 (H) 04/12/2016   CREATININE 1.36 (H) 04/11/2016   Estimated Creatinine Clearance: 28.2 mL/min (by C-G formula based on SCr of 1.67 mg/dL (H)). Hemoglobin & Hematocrit     Component Value Date/Time   HGB 12.0 (L) 04/10/2016 0428   HGB 11.2 (L) 01/30/2013 0418   HCT 35.7 (L) 04/10/2016 0428   HCT 34.5 (L) 01/30/2013 [id number]     Per Protocol for Patient with estCrcl < 30 ml/min and BMI < 40, will transition to Lovenox 30 mg Q24h.     Paulina Fusi, PharmD, BCPS 04/23/2016 10:34 AM

## 2016-04-13 NOTE — Consult Note (Signed)
[facility]  CONSULT NOTE    Date: 05/06/2016                  Patient Name:  Troy Orr  MRN: [id number]  DOB: 07-16-1921  Age / Sex: 82 y.o., male         PCP: Idelle Crouch, MD                 Service Requesting Consult: Dr. Mortimer Fries                 Reason for Consult: Acute renal failure            History of Present Illness: Troy Orr is a 82 y.o. white male with diabetes mellitus type II, diabetic neuropathy, coronary artery disease status post CABG, hypertension, hyperlipidemia, atrial fibrillation, who was admitted to [facility] on 04/16/2016 for Cellulitis of right lower extremity [L03.115] Diabetic foot infection ([place]) [E11.69, L08.9] Sepsis, due to unspecified organism [facility]) [A41.9]   Patient was found to have osteomyelitis and went to the OR for debridement this morning where he went into cardiac arrest during procedure.   Nephrology consulted for acute renal failure. Creatinine has continued to rise since admission. History of acute renal failure in 01/2013 where patient saw Dr. Candiss Norse.    Medications: Outpatient medications: Prescriptions Prior to Admission  Medication Sig Dispense Refill Last Dose  . acetaminophen (TYLENOL) 325 MG tablet Take 650 mg by mouth every 6 (six) hours as needed.   prn at prn  . aspirin 81 MG tablet Take 81 mg by mouth daily.   04/08/2016 at 0900  . Calcium Carbonate Antacid (TUMS E-X SUGAR FREE PO) Take 1 tablet by mouth every 4 (four) hours as needed.   prn at prn  . carbamide peroxide (DEBROX) 6.5 % otic solution Place 5 drops into both ears 2 (two) times daily as needed.   prn at prn  . diphenoxylate-atropine (LOMOTIL) 2.5-0.025 MG tablet Take 1 tablet by mouth as needed for diarrhea or loose stools.   prn at prn  . guaifenesin (DIABETIC TUSSIN EX) 100 MG/5ML syrup Take 200 mg by mouth every 4 (four) hours as needed for cough.   prn at prn  . hydrOXYzine (ATARAX/VISTARIL) 25 MG tablet Take 25 mg by  mouth every 6 (six) hours as needed.   prn at prn  . metFORMIN (GLUCOPHAGE) 1000 MG tablet Take 500 mg by mouth 3 (three) times daily.    04/08/2016 at 0800  . metoprolol succinate (TOPROL-XL) 50 MG 24 hr tablet    05/01/2016 at 0900  . Multiple Vitamin (DAILY VITE) TABS Take 1 tablet by mouth daily.   04/15/2016 at pm  . omeprazole (PRILOSEC) 20 MG capsule Take 20 mg by mouth daily.   05/01/2016 at 0700  . ondansetron (ZOFRAN) 4 MG tablet Take 4 mg by mouth every 8 (eight) hours as needed for nausea or vomiting.   prn at prn  . polycarbophil (FIBERCON) 625 MG tablet Take 625 mg by mouth 2 (two) times daily.   04/28/2016 at 0900  . polyethylene glycol (MIRALAX / GLYCOLAX) packet Take 17 g by mouth daily.   04/08/2016 at pm  . simethicone (MYLICON) 0000000 MG chewable tablet Chew 125 mg by mouth 4 (four) times daily -  before meals and at bedtime.   04/11/2016 at 0730  . torsemide (DEMADEX) 20 MG tablet Take 20 mg by mouth once.    04/16/2016 at 0900  . traZODone (  DESYREL) 50 MG tablet Take 50 mg by mouth at bedtime.   04/08/2016 at 2130  . vitamin B-12 (CYANOCOBALAMIN) 1000 MCG tablet Take 1,000 mcg by mouth daily.   04/08/2016 at pm    Current medications: Current Facility-Administered Medications  Medication Dose Route Frequency Provider Last Rate Last Dose  . 0.9 %  sodium chloride infusion   Intravenous Continuous Vaughan Basta, MD 75 mL/hr at 04/30/2016 0218    . 0.9 %  sodium chloride infusion  250 mL Intravenous PRN Flora Lipps, MD      . 0.9 %  sodium chloride infusion  250 mL Intravenous PRN Flora Lipps, MD      . acetaminophen (TYLENOL) tablet 650 mg  650 mg Oral Q4H PRN Flora Lipps, MD      . amLODipine (NORVASC) tablet 5 mg  5 mg Oral Daily Theodoro Grist, MD   5 mg at 04/11/16 0933  . aspirin chewable tablet 81 mg  81 mg Per Tube Daily Sheema M Hallaji, RPH      . docusate sodium (COLACE) capsule 100 mg  100 mg Oral BID Theodoro Grist, MD   100 mg at 04/12/16 2142  . famotidine (PEPCID)  IVPB 20 mg premix  20 mg Intravenous Q12H Flora Lipps, MD      . fentaNYL (SUBLIMAZE) injection 25 mcg  25 mcg Intravenous Q5 min PRN Alvin Critchley, MD      . heparin injection 5,000 Units  5,000 Units Subcutaneous Q8H Flora Lipps, MD      . HYDROcodone-acetaminophen (NORCO/VICODIN) 5-325 MG per tablet 1-2 tablet  1-2 tablet Oral Q4H PRN Theodoro Grist, MD   1 tablet at 04/11/16 2034  . insulin aspart (novoLOG) injection 0-5 Units  0-5 Units Subcutaneous QHS Theodoro Grist, MD      . insulin aspart (novoLOG) injection 0-9 Units  0-9 Units Subcutaneous TID WC Theodoro Grist, MD   1 Units at 04/12/16 1756  . insulin aspart (novoLOG) injection 4 Units  4 Units Subcutaneous TID WC Theodoro Grist, MD   4 Units at 04/12/16 1757  . linagliptin (TRADJENTA) tablet 5 mg  5 mg Oral Daily Theodoro Grist, MD   5 mg at 04/11/16 0933  . meclizine (ANTIVERT) tablet 25 mg  25 mg Oral TID PRN Theodoro Grist, MD      . metoprolol (LOPRESSOR) tablet 50 mg  50 mg Oral BID Theodoro Grist, MD   50 mg at 04/19/2016 0737  . ondansetron (ZOFRAN) tablet 4 mg  4 mg Oral Q6H PRN Theodoro Grist, MD   4 mg at 04/12/16 2342   Or  . ondansetron (ZOFRAN) injection 4 mg  4 mg Intravenous Q6H PRN Theodoro Grist, MD      . ondansetron (ZOFRAN) injection 4 mg  4 mg Intravenous Once PRN Alvin Critchley, MD      . pantoprazole (PROTONIX) EC tablet 40 mg  40 mg Oral Daily Theodoro Grist, MD   40 mg at 04/11/16 0933  . piperacillin-tazobactam (ZOSYN) IVPB 3.375 g  3.375 g Intravenous Q8H Sheema M Hallaji, RPH   3.375 g at 04/25/2016 0631  . polycarbophil (FIBERCON) tablet 625 mg  625 mg Oral BID Theodoro Grist, MD   625 mg at 04/12/16 2141  . polyethylene glycol (MIRALAX / GLYCOLAX) packet 17 g  17 g Oral Daily Theodoro Grist, MD   17 g at 04/11/16 0933  . senna (SENOKOT) tablet 8.6 mg  1 tablet Oral Daily Theodoro Grist, MD   8.6 mg at 04/11/16 0933  .  simethicone (MYLICON) chewable tablet 80 mg  80 mg Oral QID Theodoro Grist, MD   80 mg at 04/12/16 2141  .  simvastatin (ZOCOR) tablet 40 mg  40 mg Oral Daily Theodoro Grist, MD   40 mg at 04/11/16 0934  . sodium chloride flush (NS) 0.9 % injection 10-40 mL  10-40 mL Intracatheter Q12H Flora Lipps, MD      . sodium chloride flush (NS) 0.9 % injection 10-40 mL  10-40 mL Intracatheter PRN Flora Lipps, MD      . sodium chloride flush (NS) 0.9 % injection 3 mL  3 mL Intravenous Q12H Theodoro Grist, MD   3 mL at 04/11/16 0935  . sodium chloride flush (NS) 0.9 % injection 3 mL  3 mL Intravenous Q12H Flora Lipps, MD      . sodium chloride flush (NS) 0.9 % injection 3 mL  3 mL Intravenous PRN Flora Lipps, MD      . traZODone (DESYREL) tablet 50 mg  50 mg Oral QHS Theodoro Grist, MD   50 mg at 04/12/16 2142  . vancomycin (VANCOCIN) IVPB 1000 mg/200 mL premix  1,000 mg Intravenous Q36H Vira Blanco, RPH      . vitamin B-12 (CYANOCOBALAMIN) tablet 1,000 mcg  1,000 mcg Oral Daily Theodoro Grist, MD   1,000 mcg at 04/11/16 [id number]      Allergies: No Known Allergies    Past Medical History: Past Medical History:  Diagnosis Date  . Arthritis   . Cancer [facility])    Colon   . Diabetes ([place])   . Heart trouble      Past Surgical History: Past Surgical History:  Procedure Laterality Date  . heart bypass       Family History: History reviewed. No pertinent family history.   Social History: Social History   Social History  . Marital status: Married    Spouse name: N/A  . Number of children: N/A  . Years of education: N/A   Occupational History  . Not on file.   Social History Main Topics  . Smoking status: Never Smoker  . Smokeless tobacco: Never Used  . Alcohol use No  . Drug use: No  . Sexual activity: Not on file   Other Topics Concern  . Not on file   Social History Narrative  . No narrative on file     Review of Systems: Review of Systems  Unable to perform ROS: Critical illness    Vital Signs: Blood pressure 109/77, pulse 75, temperature 97 F (36.1 C), temperature source  Oral, resp. rate 16, height 5\' 9"  (1.753 m), weight 73 kg (161 lb), SpO2 (!) 70 %.  Weight trends: Filed Weights   05/01/2016 1619  Weight: 73 kg (161 lb)    Physical Exam: General: Critically ill  Head: +ETT  Eyes: Eyes closed  Neck: Supple, trachea midline  Lungs:  Diminished, PRVC FiO2 100%  Heart: Regular rate and rhythm  Abdomen:  Soft, nontender  Extremities: no peripheral edema.  Neurologic: Intubated, sedated  Skin: No lesions        Lab results: Basic Metabolic Panel:  Recent Labs Lab 04/10/16 0428 04/11/16 0358 04/12/16 0345 04/11/2016 0509  NA 138 139  --  139  K 4.0 4.0  --  4.2  CL 109 109  --  109  CO2 23 19*  --  20*  GLUCOSE 191* 96  --  134*  BUN 32* 30*  --  39*  CREATININE 1.20 1.36* 1.49* 1.67*  CALCIUM 8.0* 8.3*  --  8.3*    Liver Function Tests:  Recent Labs Lab 05/06/2016 1622  AST 25  ALT 20  ALKPHOS 79  BILITOT 0.4  PROT 8.5*  ALBUMIN 3.4*   No results for input(s): LIPASE, AMYLASE in the last 168 hours. No results for input(s): AMMONIA in the last 168 hours.  CBC:  Recent Labs Lab 04/12/2016 1622 04/10/2016 2222 04/10/16 0428  WBC 15.6* 11.8* 9.4  NEUTROABS 12.9*  --   --   HGB 13.2 12.2* 12.0*  HCT 39.4* 37.1* 35.7*  MCV 96.8 96.4 96.8  PLT 164 137* 134*    Cardiac Enzymes:  Recent Labs Lab 04/18/2016 2222 04/10/16 0428 04/10/16 1006  TROPONINI 0.06* 0.15* 0.20*    BNP: Invalid input(s): POCBNP  CBG:  Recent Labs Lab 04/12/16 1135 04/12/16 1740 04/12/16 2132 04/30/2016 0720 05/01/2016 0958  GLUCAP 97 144* 147* 123* 116*    Microbiology: Results for orders placed or performed during the hospital encounter of 05/01/2016  Culture, blood (Routine x 2)     Status: None (Preliminary result)   Collection Time: 04/30/2016  4:22 PM  Result Value Ref Range Status   Specimen Description BLOOD  R ARM   Final   Special Requests   Final    BOTTLES DRAWN AEROBIC AND ANAEROBIC  AER 9 ML ANA 8 ML   Culture NO GROWTH 4 DAYS   Final   Report Status PENDING  Incomplete  Culture, blood (Routine x 2)     Status: None (Preliminary result)   Collection Time: 04/10/2016  5:17 PM  Result Value Ref Range Status   Specimen Description BLOOD  L NEW IV   Final   Special Requests   Final    BOTTLES DRAWN AEROBIC AND ANAEROBIC  AER 7 ML ANA 5 ML   Culture NO GROWTH 4 DAYS  Final   Report Status PENDING  Incomplete  Wound or Superficial Culture     Status: None (Preliminary result)   Collection Time: 04/16/2016  5:17 PM  Result Value Ref Range Status   Specimen Description WOUND RIGHT FOOT  Final   Special Requests NONE  Final   Gram Stain   Final    FEW WBC PRESENT,BOTH PMN AND MONONUCLEAR ABUNDANT GRAM POSITIVE COCCI IN PAIRS ABUNDANT GRAM NEGATIVE RODS    Culture   Final    MODERATE MORGANELLA MORGANII ABUNDANT PROTEUS MIRABILIS SUSCEPTIBILITIES TO FOLLOW Performed at [facility]    Report Status PENDING  Incomplete   Organism ID, Bacteria MORGANELLA MORGANII  Final      Susceptibility   Morganella morganii - MIC*    AMPICILLIN >=32 RESISTANT Resistant     CEFAZOLIN >=64 RESISTANT Resistant     CEFEPIME <=1 SENSITIVE Sensitive     CEFTAZIDIME <=1 SENSITIVE Sensitive     CEFTRIAXONE <=1 SENSITIVE Sensitive     CIPROFLOXACIN <=0.25 SENSITIVE Sensitive     GENTAMICIN <=1 SENSITIVE Sensitive     IMIPENEM 1 SENSITIVE Sensitive     TRIMETH/SULFA <=20 SENSITIVE Sensitive     AMPICILLIN/SULBACTAM 16 INTERMEDIATE Intermediate     PIP/TAZO <=4 SENSITIVE Sensitive     * MODERATE MORGANELLA MORGANII  MRSA PCR Screening     Status: None   Collection Time: 04/27/2016  8:50 PM  Result Value Ref Range Status   MRSA by PCR NEGATIVE NEGATIVE Final    Comment:        The GeneXpert MRSA Assay (FDA approved for NASAL specimens only), is one  component of a comprehensive MRSA colonization surveillance program. It is not intended to diagnose MRSA infection nor to guide or monitor treatment for MRSA infections.    Surgical PCR screen     Status: None   Collection Time: 04/12/16 11:34 PM  Result Value Ref Range Status   MRSA, PCR NEGATIVE NEGATIVE Final   Staphylococcus aureus NEGATIVE NEGATIVE Final    Comment:        The Xpert SA Assay (FDA approved for NASAL specimens in patients over 66 years of age), is one component of a comprehensive surveillance program.  Test performance has been validated by [facility] for patients greater than or equal to 69 year old. It is not intended to diagnose infection nor to guide or monitor treatment.     Coagulation Studies: No results for input(s): LABPROT, INR in the last 72 hours.  Urinalysis: No results for input(s): COLORURINE, LABSPEC, PHURINE, GLUCOSEU, HGBUR, BILIRUBINUR, KETONESUR, PROTEINUR, UROBILINOGEN, NITRITE, LEUKOCYTESUR in the last 72 hours.  Invalid input(s): APPERANCEUR    Imaging: Mr Foot Right Wo Contrast  Result Date: 04/12/2016 CLINICAL DATA:  Right foot ulcers. EXAM: MRI OF THE RIGHT FOREFOOT WITHOUT CONTRAST TECHNIQUE: Multiplanar, multisequence MR imaging was performed. No intravenous contrast was administered. COMPARISON:  Radiographs 04/08/2016 FINDINGS: Large skin ulcer noted along the lateral and plantar aspect of the fifth metatarsal head. Extensive surrounding cellulitis and fluid collections worrisome for abscesses. There is also osteomyelitis involving the proximal phalanx of the fifth toe and also the fifth metatarsal head a possible small walled-off bone abscess. There is likely intervening septic arthritis and septic tenosynovitis. Diffuse cellulitis and myofasciitis. No obvious changes of pyomyositis. The other bony structures are intact. I do not see any other definite sites of osteomyelitis. Moderate degenerative changes. IMPRESSION: 1. MR findings consistent with septic arthritis involving the fifth metatarsal phalangeal joint with osteomyelitis involving the fifth metatarsal head and fifth proximal phalanx. 2.  Large open foot ulcer along the lateral aspect of the fifth metatarsal head with surrounding cellulitis. Fluid collections could suggest abscesses. 3. Diffuse cellulitis and myofasciitis involving the forefoot. Electronically Signed   By: Marijo Sanes M.D.   On: 04/12/2016 17:35      Assessment & Plan: Troy Orr is a 82 y.o. white male with diabetes mellitus type II, diabetic neuropathy, coronary artery disease status post CABG, hypertension, hyperlipidemia, atrial fibrillation, who was admitted to [facility] on 04/16/2016  1. Acute Renal Failure with lactic acidosis on chronic kidney disease stage III: Baseline GFR of 52 on 09/07/15. Suspect renal function will get worse before it gets better. No acute indication dialysis at this time.  - place foley catheter for accurate urine output. Continue to monitor volume status, electrolytes and renal function.  - Lactic acid pending  2. Osteomyelitis/cellultis and sepsis: surgery was cut short due to acute cardiac arrest and code blue.  - vanco and zosyn - Appreciate podiatry and vascular input.   3. Diabetes mellitus type II with chronic kidney disease: on metformin. With proteinuria on urinalysis. Hemoglobin A1c 7.9% - continue glucose control. Hold metformin.   LOS: [place], [place] 10/8/201711:10 AM

## 2016-04-13 NOTE — Progress Notes (Signed)
Opens eyes to pain, does not follow commands.  No sedation given since arriving to ICU this morning.  NSR with PAC and PVC.  Tolerating vent on 40% FiO2.  5 cc UOP total this shift.  Dressing to right foot reinforced twice since coming from surgery.  Family visited during the day and were updated by Dr. Elvina Mattes and Dr. Mortimer Fries.

## 2016-04-13 NOTE — Progress Notes (Signed)
[facility]  Daily Progress Note   Subjective  - Day of Surgery  Had a Code event today in OR for debridement of his foot by podiatry.  Now intubated and on pressors.   Unable to provide any history.  Objective Vitals:   04/18/2016 1432 04/08/2016 1445 05/03/2016 1500 04/23/2016 1515  BP: 90/70 98/85 96/68    Pulse: 84 81 82 85  Resp:   (!) 25   Temp: 97 F (36.1 C) 97 F (36.1 C) 97 F (36.1 C) 97.2 F (36.2 C)  TempSrc:      SpO2: 100% 100% 100% 100%  Weight:      Height:        Intake/Output Summary (Last 24 hours) at 04/23/2016 1538 Last data filed at 04/20/2016 1500  Gross per 24 hour  Intake          4149.55 ml  Output               10 ml  Net          4139.55 ml    PULM  Carse BS on the vent CV  RRR VASC  Right foot dressed currently, no palpable distal pulses  Laboratory CBC    Component Value Date/Time   WBC 26.0 (H) 04/27/2016 1147   HGB 13.8 04/10/2016 1147   HGB 11.2 (L) 01/30/2013 0418   HCT 43.6 04/11/2016 1147   HCT 34.5 (L) 01/30/2013 0418   PLT 175 04/25/2016 1147   PLT 82 (L) 01/30/2013 0418    BMET    Component Value Date/Time   NA 139 04/10/2016 1147   NA 137 01/30/2013 0418   K 4.4 04/29/2016 1147   K 4.2 01/30/2013 0418   CL 109 04/07/2016 1147   CL 106 01/30/2013 0418   CO2 15 (L) 04/14/2016 1147   CO2 28 01/30/2013 0418   GLUCOSE 111 (H) 04/27/2016 1147   GLUCOSE 93 01/30/2013 0418   BUN 39 (H) 04/21/2016 1147   BUN 10 01/30/2013 0418   CREATININE 1.73 (H) 05/04/2016 1147   CREATININE 0.95 01/30/2013 0418   CALCIUM 8.2 (L) 05/04/2016 1147   CALCIUM 8.1 (L) 01/30/2013 0418   GFRNONAA 33 (L) 04/09/2016 1147   GFRNONAA >60 01/30/2013 0418   GFRAA 38 (L) 04/16/2016 1147   GFRAA >60 01/30/2013 0418    Assessment/Planning:    Patient with PAD and ulceration and infection of the right foot.    Was on schedule for tomorrow for angiogram, but this will have to be held due to his code event today  Now on the  vent and requiring pressors for hypotension after his code.  Given his age and comorbidities, does not sound favorable for recovery.  If he does improve, will plan for an angiogram once he is more stable and off of pressors and the vent.  No vascular recs at this time.      Leotis Pain  04/27/2016, 3:38 PM

## 2016-04-13 NOTE — Progress Notes (Signed)
Chaplain responded to code in OR-8.  Patient transferred to ICU-8. Troy Orr [phone]

## 2016-04-13 NOTE — Progress Notes (Signed)
Pharmacy Antibiotic Note  Troy Orr is a 82 y.o. male admitted on 04/24/2016 with Sepsis due to right foot osteomyelitis/cellulitis.  Pharmacy has been consulted for Vancomycin and Zosyn dosing. Patient received Vancomycin 1gm IV x1 and Zosyn 3.375 Iv x1 in ED.   Currently on Vancomycin 1g IV q36h and Zosyn 3.375g IV q8h EI. SCr continues to trend up. SCr 1.20>>1.36>>1.49>>1.67  10/6 PM Vanc trough 82mcg/ml (not a steady state level)  Plan: Continue with Vancomycin 1g IV q36h (dose decreased 10/7) and recheck SCr with AM labs and Vanc trough prior to 10/9 23:00 dose (not SS level).  Height: 5\' 9"  (175.3 cm) Weight: 161 lb (73 kg) IBW/kg (Calculated) : 70.7  Temp (24hrs), Avg:97.4 F (36.3 C), Min:96.4 F (35.8 C), Max:98.5 F (36.9 C)   Recent Labs Lab 04/12/2016 1622 05/04/2016 1926 04/12/2016 2222 04/10/16 0428 04/11/16 0358 04/11/16 2209 04/12/16 0345 04/09/2016 0509  WBC 15.6*  --  11.8* 9.4  --   --   --   --   CREATININE 1.41*  --  1.18 1.20 1.36*  --  1.49* 1.67*  LATICACIDVEN 4.0* 2.0* 1.7  --   --   --   --   --   VANCOTROUGH  --   --   --   --   --  19  --   --     Estimated Creatinine Clearance: 28.2 mL/min (by C-G formula based on SCr of 1.67 mg/dL (H)).    No Known Allergies  Antimicrobials this admission: 10/4 Zosyn >>  10/4 Vancomycin >>   Dose adjustments this admission: 10/7 Decrease Vancomycin to 1g IV q36h   Microbiology results: 10/4 BCx: pending  10/4 Wound Cx: pending  10/4 UCx: pending 10/4 MRSA PCR:   Thank you for allowing pharmacy to be a part of this patient's care.  Paulina Fusi, PharmD, BCPS 05/06/2016 10:27 AM

## 2016-04-13 NOTE — Progress Notes (Signed)
Code Blue called to OR 8 at 0859. Upon arrival of myself and code team at 0900, Dr. Kayleen Memos and Dr.Troxler at bedside. Dr. Kayleen Memos running code and released ED code team. OR staff performing CPR. Drugs given by Dr. Kayleen Memos and CRNA during code. Anesthesiologist stated patient brady'd down, then became pulseless in PEA after sedation given as they were preparing for procedure. ACLS protocol started by OR team prior to code teams arrival. Patient transferred to ICU 8 after pulse regained and stabilized. See Code Blue sheet.

## 2016-04-13 NOTE — Procedures (Signed)
Central Venous Catheter Placement: Indication: Patient receiving vesicant or irritant drug.; Patient receiving intravenous therapy for longer than 5 days.; Patient has limited or no vascular access.   Consent:emergent  Risks and benefits explained in detail including risk of infection, bleeding, respiratory failure and death..   Hand washing performed prior to starting the procedure.   Procedure: An active timeout was performed and correct patient, name, & ID confirmed.  After explaining risk and benefits, patient was positioned correctly for central venous access. Patient was prepped using strict sterile technique including chlorohexadine preps, sterile drape, sterile gown and sterile gloves.  The area was prepped, draped and anesthetized in the usual sterile manner. Patient comfort was obtained.  A triple lumen catheter was placed in LEFT Internal Jugular Vein There was good blood return, catheter caps were placed on lumens, catheter flushed easily, the line was secured and a sterile dressing and BIO-PATCH applied.   Ultrasound was used to visualize vasculature and guidance of needle.   Number of Attempts: 1 Complications:none Estimated Blood Loss: none Chest Radiograph indicated and ordered.  Operator: Zeek Rostron.   Ilisa Hayworth David Leaf K[employer], M.D.  [facility]  Medical Director ICU-[facility] [place] Medical Director [facility] Cardio-Pulmonary Department     

## 2016-04-13 NOTE — H&P (Signed)
PULMONARY / CRITICAL CARE MEDICINE   Name: STANFORD STRAUCH MRN: [id number] DOB: [birth year]    ADMISSION DATE:  04/16/2016  CHIEF COMPLAINT: acute cardiac arrest     HISTORY OF PRESENT ILLNESS:   82 yo white male admitted to ICU s/p cardiac arrest Patient admitted on 10/4 for RT foot osteomyelitis 10/8 to OR for amputation general anesthesia Preop diagnosis:  Osteomyelitis and septic arthritis right fifth metatarsal phalangeal joint Procedure: - Fifth ray amputation right foot   20 mins into procedure, patient had low HR and PEA cardiac arrest that lasted for apporx 20 mins according to OR staff Patient now intubated, off sedation on vasopressors, severe hypoxia CVL and art  Placed emergently   PAST MEDICAL HISTORY :   has a past medical history of Arthritis; Cancer ([place]); Diabetes ([place]); and Heart trouble.  has a past surgical history that includes heart bypass. Prior to Admission medications   Medication Sig Start Date End Date Taking? Authorizing Provider  acetaminophen (TYLENOL) 325 MG tablet Take 650 mg by mouth every 6 (six) hours as needed.   Yes Historical Provider, MD  aspirin 81 MG tablet Take 81 mg by mouth daily.   Yes Historical Provider, MD  Calcium Carbonate Antacid (TUMS E-X SUGAR FREE PO) Take 1 tablet by mouth every 4 (four) hours as needed.   Yes Historical Provider, MD  carbamide peroxide (DEBROX) 6.5 % otic solution Place 5 drops into both ears 2 (two) times daily as needed.   Yes Historical Provider, MD  diphenoxylate-atropine (LOMOTIL) 2.5-0.025 MG tablet Take 1 tablet by mouth as needed for diarrhea or loose stools.   Yes Historical Provider, MD  guaifenesin (DIABETIC TUSSIN EX) 100 MG/5ML syrup Take 200 mg by mouth every 4 (four) hours as needed for cough.   Yes Historical Provider, MD  hydrOXYzine (ATARAX/VISTARIL) 25 MG tablet Take 25 mg by mouth every 6 (six) hours as needed.   Yes Historical Provider, MD  metFORMIN (GLUCOPHAGE) 1000 MG tablet Take  500 mg by mouth 3 (three) times daily.    Yes Historical Provider, MD  metoprolol succinate (TOPROL-XL) 50 MG 24 hr tablet  11/16/13  Yes Historical Provider, MD  Multiple Vitamin (DAILY VITE) TABS Take 1 tablet by mouth daily.   Yes Historical Provider, MD  omeprazole (PRILOSEC) 20 MG capsule Take 20 mg by mouth daily.   Yes Historical Provider, MD  ondansetron (ZOFRAN) 4 MG tablet Take 4 mg by mouth every 8 (eight) hours as needed for nausea or vomiting.   Yes Historical Provider, MD  polycarbophil (FIBERCON) 625 MG tablet Take 625 mg by mouth 2 (two) times daily.   Yes Historical Provider, MD  polyethylene glycol (MIRALAX / GLYCOLAX) packet Take 17 g by mouth daily.   Yes Historical Provider, MD  simethicone (MYLICON) 935 MG chewable tablet Chew 125 mg by mouth 4 (four) times daily -  before meals and at bedtime.   Yes Historical Provider, MD  torsemide (DEMADEX) 20 MG tablet Take 20 mg by mouth once.  11/30/13  Yes Historical Provider, MD  traZODone (DESYREL) 50 MG tablet Take 50 mg by mouth at bedtime.   Yes Historical Provider, MD  vitamin B-12 (CYANOCOBALAMIN) 1000 MCG tablet Take 1,000 mcg by mouth daily.   Yes Historical Provider, MD   No Known Allergies  FAMILY HISTORY:  has no family status information on file.   SOCIAL HISTORY:  reports that he has never smoked. He has never used smokeless tobacco. He reports that he does  not drink alcohol or use drugs.  REVIEW OF SYSTEMS:   Can not obtain due to critical illness    VITAL SIGNS: Temp:  [96.4 F (35.8 C)-98.5 F (36.9 C)] 97 F (36.1 C) (10/08 0733) Pulse Rate:  [77-109] 77 (10/08 1020) Resp:  [16-24] 16 (10/08 0955) BP: (84-133)/(47-87) 85/62 (10/08 1020) SpO2:  [58 %-98 %] 58 % (10/08 1020) FiO2 (%):  [100 %] 100 % (10/08 1002) HEMODYNAMICS:   VENTILATOR SETTINGS: Vent Mode: PRVC FiO2 (%):  [100 %] 100 % Set Rate:  [16 bmp] 16 bmp Vt Set:  [550 mL] 550 mL PEEP:  [12 cmH20] 12 cmH20 INTAKE /  OUTPUT:  Intake/Output Summary (Last 24 hours) at 04/28/2016 1047 Last data filed at 05/04/2016 0952  Gross per 24 hour  Intake          3676.25 ml  Output                5 ml  Net          3671.25 ml    PHYSICAL EXAMINATION: PHYSICAL EXAMINATION: Physical Examination:   GENERAL:critically ill appearing, +resp distress HEAD: Normocephalic, atraumatic.  EYES: Pupils equal, round, reactive to light.  No scleral icterus.  MOUTH: Moist mucosal membrane. NECK: Supple. No thyromegaly. No nodules. No JVD. c collar in place PULMONARY: Diffuse coarse rhonchi right sided +wheezes +rhonchi CARDIOVASCULAR: S1 and S2. Regular rate and rhythm. No murmurs, rubs, or gallops.  GASTROINTESTINAL: Soft, nontender, +distended. No masses. Positive bowel sounds. No hepatosplenomegaly.  MUSCULOSKELETAL: No swelling, clubbing, or edema.  NEUROLOGIC: GCS<8T SKIN:intact,warm,dry    LABS:  CBC  Recent Labs Lab 04/08/2016 1622 04/11/2016 2222 04/10/16 0428  WBC 15.6* 11.8* 9.4  HGB 13.2 12.2* 12.0*  HCT 39.4* 37.1* 35.7*  PLT 164 137* 134*   Coag's No results for input(s): APTT, INR in the last 168 hours. BMET  Recent Labs Lab 04/10/16 0428 04/11/16 0358 04/12/16 0345 05/05/2016 0509  NA 138 139  --  139  K 4.0 4.0  --  4.2  CL 109 109  --  109  CO2 23 19*  --  20*  BUN 32* 30*  --  39*  CREATININE 1.20 1.36* 1.49* 1.67*  GLUCOSE 191* 96  --  134*   Electrolytes  Recent Labs Lab 04/10/16 0428 04/11/16 0358 04/19/2016 0509  CALCIUM 8.0* 8.3* 8.3*   Sepsis Markers  Recent Labs Lab 05/03/2016 1622 04/21/2016 1926 04/26/2016 2222  LATICACIDVEN 4.0* 2.0* 1.7   ABG No results for input(s): PHART, PCO2ART, PO2ART in the last 168 hours. Liver Enzymes  Recent Labs Lab 04/25/2016 1622  AST 25  ALT 20  ALKPHOS 79  BILITOT 0.4  ALBUMIN 3.4*   Cardiac Enzymes  Recent Labs Lab 05/05/2016 2222 04/10/16 0428 04/10/16 1006  TROPONINI 0.06* 0.15* 0.20*   Glucose  Recent Labs Lab  04/12/16 0748 04/12/16 1135 04/12/16 1740 04/12/16 2132 04/18/2016 0720 04/18/2016 0958  GLUCAP 100* 97 144* 147* 123* 116*    Imaging Mr Foot Right Wo Contrast  Result Date: 04/12/2016 CLINICAL DATA:  Right foot ulcers. EXAM: MRI OF THE RIGHT FOREFOOT WITHOUT CONTRAST TECHNIQUE: Multiplanar, multisequence MR imaging was performed. No intravenous contrast was administered. COMPARISON:  Radiographs 04/21/2016 FINDINGS: Large skin ulcer noted along the lateral and plantar aspect of the fifth metatarsal head. Extensive surrounding cellulitis and fluid collections worrisome for abscesses. There is also osteomyelitis involving the proximal phalanx of the fifth toe and also the fifth metatarsal head a possible small walled-off   bone abscess. There is likely intervening septic arthritis and septic tenosynovitis. Diffuse cellulitis and myofasciitis. No obvious changes of pyomyositis. The other bony structures are intact. I do not see any other definite sites of osteomyelitis. Moderate degenerative changes. IMPRESSION: 1. MR findings consistent with septic arthritis involving the fifth metatarsal phalangeal joint with osteomyelitis involving the fifth metatarsal head and fifth proximal phalanx. 2. Large open foot ulcer along the lateral aspect of the fifth metatarsal head with surrounding cellulitis. Fluid collections could suggest abscesses. 3. Diffuse cellulitis and myofasciitis involving the forefoot. Electronically Signed   By: P.  Gallerani M.D.   On: 04/12/2016 17:35   82 yo white male admitted to ICU for acute cardiac arrest likley from acute MI with underlying CAD with RT foot osetyo s/p amputation with underlying acute renal failure    PULMONARY -Respiratory Failure -continue Full MV support -continue Bronchodilator Therapy -Wean Fio2 and PEEP as tolerated -will perform SAT/SBt when respiratory parameters are met   CARDIOVASCULAR Shock-cardiogenic- Vasopressors to keep  MAP>65   RENAL Watch UO  foley catheter  GASTROINTESTINAL Npo for now  HEMATOLOGIC Follow CBC  INFECTIOUS Rt foot osteo- -continue iv abx   NEUROLOGIC - intubated and sedated - minimal sedation to achieve a RASS goal: -1    I have personally obtained a history, examined the patient, evaluated Pertinent laboratory and RadioGraph[employer]/imaging results, and  formulated the assessment and plan   The Patient requires high complexity decision making for assessment and support, frequent evaluation and titration of therapies, application of advanced monitoring technologies and extensive interpretation of multiple databases. Critical Care Time devoted to patient care services described in this note is 55 minutes.   Overall, patient is critically ill, prognosis is guarded.  Patient with Multiorgan failure and at high risk for cardiac arrest and death. Recommend DNR status   Kurian David Kasa, M.D.  [facility]  Medical Director ICU-[facility] Medical Director [facility] Cardio-Pulmonary Department      

## 2016-04-13 NOTE — Op Note (Signed)
Operative note   Surgeon: Dr. Albertine Patricia, DPM.    Assistant: None    Preop diagnosis: Osteomyelitis and septic arthritis right fifth metatarsal phalangeal joint    Postop diagnosis: Same    Procedure:   1. Fifth ray amputation right foot          EBL: 10 cc    Anesthesia:IV sedation delivered by anesthesia team and local anesthesia delivered by me. This included 8 cc of combination 0.5% Marcaine plain and lidocaine plain    Hemostasis: Ankle tourniquet 250 mils mercury pressure    Specimen: Infected bone and cartilage and soft tissue including the fifth toe from the fifth metatarsal and fifth metatarsophalangeal joint    Complications: Patient coded prior to being able to complete the surgery. Fifth toe been amputated at the time of the toe but still the metatarsal and some infected soft tissue remained. Surgery had to be halted in order to run the code. Patient gradually stabilized and developed a pulse and blood pressure and need to be transferred to CCU.    Operative indications: Chronic diabetic ulcerations to the right foot which ultimately led to osteomyelitis of the fifth metatarsal fifth toe and also the septic joint in the region. MRI done yesterday indicated significant involvement of the fifth metatarsal and the proximal phalanx with likely infection and osteomyelitis as well as a septic fifth metatarsophalangeal joint. Operative Report: Patient brought to the accomplished the OR table in the supine position to this point after sedation was achieved by anesthesia team local anesthesia achieved by me the patient was then prepped and draped in usual sterile manner. At this time attention was directed to the fifth toe and fifth metatarsal. The patient had 2 separate ulcerations 1 on the lateral and one on the plantar aspects of the fifth metatarsal head region. Both ulcerations had significant drainage and purulence. An incision was made in a mostly elliptical fashion trying  to preserve as much of the normal skin and soft tissue as possible which would hopefully allow viable primary closure. This was carried down to bone and the fifth toe was degree debrided and separated and removed. At this point patient coded and a code was called and the code team came into the OR to resuscitate patient. Anesthesia and the code team were successful in stabilizing the patient but needed to transfer him to CCU ASAP. The tourniquet had been released earlier at the time of the code and some bleeding was achieved and the region. At this point I was able to resect the fifth metatarsal head to remove the infected bone. As previously noted the fifth toe darted been removed.. I did not have the opportunity to use a versa jet to debride any other infected tissue. The wound was packed with gentamicin and vancomycin beads and loosely sutured and Mepitel was applied over the area to maintain bead placement. A sterile dressing was placed across went this time consisting of 4 x 4's and Kerlix. Patient left the operating room to CCU.    Patient tolerated the procedure and anesthesia well.  Was transported from the OR to the PACU with all vital signs stable and vascular status intact. To be discharged per routine protocol.  Will follow up in approximately 1 week in the outpatient clinic.

## 2016-04-13 NOTE — Consult Note (Signed)
[facility]  CARDIOLOGY CONSULT NOTE  Patient ID: Troy Orr MRN: [id number] DOB/AGE: [birth year] 80 y.o.  Admit date: 04/29/2016 Referring Physician Dr. Mortimer Fries Primary Physician Dr. Doy Hutching Primary Cardiologist Dr. Ubaldo Glassing Reason for Consultation s/p cardiac arrest  HPI: Pt is a 82 yo male with history of cad s/p 2 vessel cabg and mitral ring placement in the 1990's who wsa admitted with pvd and gangenous toe. He was admitted several days ago and was treated medically and was undergoing a toe amputation today prior to lower extremety revascularization. During induction of anesthesia suffered a cardiac arrest. He received approximately 20 minutes of cpr and acls before sinus rhythm was recoverred. He was intubated at the time. He was transferred to icu on iv epi and levo. Currently intubated and unresonsive with no gag or pupilary reflex. SBP greater than 100 on pressors  Review of Systems  Unable to perform ROS: Patient unresponsive    Past Medical History:  Diagnosis Date  . Arthritis   . Cancer [facility])    Colon   . Diabetes ([place])   . Heart trouble     History reviewed. No pertinent family history.  Social History   Social History  . Marital status: Married    Spouse name: N/A  . Number of children: N/A  . Years of education: N/A   Occupational History  . Not on file.   Social History Main Topics  . Smoking status: Never Smoker  . Smokeless tobacco: Never Used  . Alcohol use No  . Drug use: No  . Sexual activity: Not on file   Other Topics Concern  . Not on file   Social History Narrative  . No narrative on file    Past Surgical History:  Procedure Laterality Date  . heart bypass       Prescriptions Prior to Admission  Medication Sig Dispense Refill Last Dose  . acetaminophen (TYLENOL) 325 MG tablet Take 650 mg by mouth every 6 (six) hours as needed.   prn at prn  . aspirin 81 MG tablet Take 81 mg by mouth daily.    04/29/2016 at 0900  . Calcium Carbonate Antacid (TUMS E-X SUGAR FREE PO) Take 1 tablet by mouth every 4 (four) hours as needed.   prn at prn  . carbamide peroxide (DEBROX) 6.5 % otic solution Place 5 drops into both ears 2 (two) times daily as needed.   prn at prn  . diphenoxylate-atropine (LOMOTIL) 2.5-0.025 MG tablet Take 1 tablet by mouth as needed for diarrhea or loose stools.   prn at prn  . guaifenesin (DIABETIC TUSSIN EX) 100 MG/5ML syrup Take 200 mg by mouth every 4 (four) hours as needed for cough.   prn at prn  . hydrOXYzine (ATARAX/VISTARIL) 25 MG tablet Take 25 mg by mouth every 6 (six) hours as needed.   prn at prn  . metFORMIN (GLUCOPHAGE) 1000 MG tablet Take 500 mg by mouth 3 (three) times daily.    04/28/2016 at 0800  . metoprolol succinate (TOPROL-XL) 50 MG 24 hr tablet    04/06/2016 at 0900  . Multiple Vitamin (DAILY VITE) TABS Take 1 tablet by mouth daily.   05/03/2016 at pm  . omeprazole (PRILOSEC) 20 MG capsule Take 20 mg by mouth daily.   04/30/2016 at 0700  . ondansetron (ZOFRAN) 4 MG tablet Take 4 mg by mouth every 8 (eight) hours as needed for nausea or vomiting.   prn at prn  .  polycarbophil (FIBERCON) 625 MG tablet Take 625 mg by mouth 2 (two) times daily.   04/16/2016 at 0900  . polyethylene glycol (MIRALAX / GLYCOLAX) packet Take 17 g by mouth daily.   04/08/2016 at pm  . simethicone (MYLICON) 0000000 MG chewable tablet Chew 125 mg by mouth 4 (four) times daily -  before meals and at bedtime.   04/08/2016 at 0730  . torsemide (DEMADEX) 20 MG tablet Take 20 mg by mouth once.    05/01/2016 at 0900  . traZODone (DESYREL) 50 MG tablet Take 50 mg by mouth at bedtime.   04/08/2016 at 2130  . vitamin B-12 (CYANOCOBALAMIN) 1000 MCG tablet Take 1,000 mcg by mouth daily.   04/08/2016 at pm    Physical Exam: Blood pressure 96/68, pulse 85, temperature 97.2 F (36.2 C), resp. rate (!) 25, height 5\' 9"  (1.753 m), weight 80 kg (176 lb 5.9 oz), SpO2 100 %.   Wt Readings from Last 1 Encounters:   04/15/2016 80 kg (176 lb 5.9 oz)     General appearance: intubated and unresponsive Resp: diminished breath sounds bilaterally Cardio: regular rate and rhythm Neurologic: Mental status: intubated and unresponsive  Labs:   Lab Results  Component Value Date   WBC 26.0 (H) 04/14/2016   HGB 13.8 04/12/2016   HCT 43.6 05/02/2016   MCV 101.1 (H) 04/21/2016   PLT 175 05/05/2016    Recent Labs Lab 04/18/2016 1622  05/04/2016 1147  NA 135  < > 139  K 4.6  < > 4.4  CL 102  < > 109  CO2 22  < > 15*  BUN 35*  < > 39*  CREATININE 1.41*  < > 1.73*  CALCIUM 9.0  < > 8.2*  PROT 8.5*  --   --   BILITOT 0.4  --   --   ALKPHOS 79  --   --   ALT 20  --   --   AST 25  --   --   GLUCOSE 182*  < > 111*  < > = values in this interval not displayed. Lab Results  Component Value Date   TROPONINI 0.13 ([place]) 04/23/2016       EKG: siinus arrhythmia  ASSESSMENT AND PLAN:  82 yo male with history of cad s/p abg now post cardiac arrest occurring during toe amputation. Etiology unclear. MI possible but troponin unremarkable thus far. Poor prognosis with prolonged cpr. Currently on high dose pressors and no gag or pupilary reflex. Discussed severeity and prognosis with family. They have made patient dnr. Will follow with you.  Signed: Teodoro Spray MD, [facility] 04/22/2016, 3:43 PM

## 2016-04-13 NOTE — Transfer of Care (Signed)
Immediate Anesthesia Transfer of Care Note  Patient: Troy Orr  Procedure(s) Performed: Procedure(s): AMPUTATION TOE (Right)  Patient Location: PACU and ICU  Anesthesia Type:MAC  Level of Consciousness: unresponsive and Patient remains intubated per anesthesia plan  Airway & Oxygen Therapy: Patient Spontanous Breathing and Patient placed on Ventilator (see vital sign flow sheet for setting)  Post-op Assessment: Report given to RN and Post -op Vital signs reviewed and stable  Post vital signs: Reviewed and stable  Last Vitals:  Vitals:   04/16/2016 1002 05/01/2016 1010  BP: (!) 84/67 (!) 85/62  Pulse: 82 79  Resp:    Temp:      Last Pain:  Vitals:   04/26/2016 0733  TempSrc: Oral  PainSc:          Complications: respiratory complications and cardiovascular complications

## 2016-04-13 NOTE — Progress Notes (Signed)
Family  Updated and Notified of patients critical status. Patient with multiorgan failure s/p cardiac arrest Prognosis is very poor after 20 mins of CPR  Family has agreed to and consented to DNR status.  Will continue medical management. They also have agreed NOT to start hemodialysis if needed    Corrin Parker, M.D.  [facility]  Medical Director [place] Director [facility] Cardio-Pulmonary Department

## 2016-04-13 NOTE — H&P (Signed)
H and P has been reviewed and no changes are noted.  

## 2016-04-13 NOTE — OR Nursing (Signed)
9:03 Responded to Medical Alert Code Blue in OR 8.  Assisted with documentation and chest compressions.

## 2016-04-13 NOTE — Anesthesia Procedure Notes (Signed)
Procedure Name: MAC Performed by: Shamera Yarberry Pre-anesthesia Checklist: Patient identified, Emergency Drugs available, Suction available, Patient being monitored and Timeout performed Patient Re-evaluated:Patient Re-evaluated prior to induction Oxygen Delivery Method: Simple face mask       

## 2016-04-13 NOTE — Progress Notes (Signed)
Patients Po2 was 340, PEEP decreased to +5 and FiO2 decreased to 40%, Per Dr. Mortimer Fries

## 2016-04-13 NOTE — Progress Notes (Signed)
[place] [facility] [place] NAME: Troy Orr    MR#:  [id number]  DATE OF BIRTH:  1922/01/02  SUBJECTIVE:  CHIEF COMPLAINT:   Chief Complaint  Patient presents with  . Foot Pain    Right      Came with pain and redness on right foot.   On broad spectrum Abx.  renal func gradually worse.   Was taken to OR and had cardiac arrest in OR today.   Intuabated after CPR and now in ICU on vasopressors.   His grand son at bedsite, now made him DNR.  REVIEW OF SYSTEMS:  Pt is intubated , can not give ROS. ROS  DRUG ALLERGIES:  No Known Allergies  VITALS:  Blood pressure 98/85, pulse 81, temperature 97 F (36.1 C), resp. rate (!) 24, height 5\' 9"  (1.753 m), weight 80 kg (176 lb 5.9 oz), SpO2 100 %.  PHYSICAL EXAMINATION:   GENERAL:  82 y.o.-year-old patient lying in the bed , critically ill.  EYES: Pupils equal, round, reactive to light . No scleral icterus. Extraocular muscles intact.  HEENT: Head atraumatic, normocephalic. Oropharynx and nasopharynx clear. ETT in place. NECK:  Supple, no jugular venous distention. No thyroid enlargement, no tenderness.  LUNGS: Normal breath sounds bilaterally, no wheezing, rales,rhonchi or crepitation. No use of accessory muscles of respiration. On vent support. CARDIOVASCULAR: S1, S2 normal. No murmurs, rubs, or gallops.  ABDOMEN: Soft, nontender, nondistended. Bowel sounds present. No organomegaly or mass.  EXTREMITIES: have dressing on right foot after surgery. NEUROLOGIC: intubated on vent support. PSYCHIATRIC: The patient is on vent.  SKIN: Right foot dressing.  Physical Exam LABORATORY PANEL:   CBC  Recent Labs Lab 04/30/2016 1147  WBC 26.0*  HGB 13.8  HCT 43.6  PLT 175   ------------------------------------------------------------------------------------------------------------------  Chemistries   Recent Labs Lab 04/11/2016 1622  04/09/2016 1147  NA 135  < > 139  K 4.6  < > 4.4  CL  102  < > 109  CO2 22  < > 15*  GLUCOSE 182*  < > 111*  BUN 35*  < > 39*  CREATININE 1.41*  < > 1.73*  CALCIUM 9.0  < > 8.2*  AST 25  --   --   ALT 20  --   --   ALKPHOS 79  --   --   BILITOT 0.4  --   --   < > = values in this interval not displayed. ------------------------------------------------------------------------------------------------------------------  Cardiac Enzymes  Recent Labs Lab 04/10/16 1006 04/06/2016 1147  TROPONINI 0.20* 0.13*   ------------------------------------------------------------------------------------------------------------------  RADIOLOGY:  Dg Chest 1 View  Result Date: 04/22/2016 CLINICAL DATA:  Hypoxia following cardiac arrest EXAM: CHEST 1 VIEW COMPARISON:  April 09, 2016 FINDINGS: Endotracheal tube tip is 6.6 cm above the carina. Central catheter tip is in the superior vena cava. Nasogastric tube tip and side port in proximal stomach. No pneumothorax. There is no edema or consolidation. Heart is mildly enlarged with pulmonary vascularity within normal limits. Prosthetic cardiac valve noted. No adenopathy evident. There is calcification in the aortic arch. IMPRESSION: Tube and catheter positions as described without pneumothorax. No edema or consolidation. Mild cardiac enlargement. Aortic atherosclerosis. Electronically Signed   By: Lowella Grip III M.D.   On: 04/18/2016 11:35   Dg Abd 1 View  Result Date: 05/02/2016 CLINICAL DATA:  Cardiac arrest with orogastric tube placement EXAM: ABDOMEN - 1 VIEW COMPARISON:  None. FINDINGS: For gastric tube tip and side port  in stomach. Note that the side port of the tube is immediately distal to the gastroesophageal junction. Bowel gas pattern is normal.  No bowel obstruction or free air. IMPRESSION: Orogastric tube tip and side-port in its proximal stomach. It may be prudent to advance the nasogastric tube 4-5 cm to insure that both the tip and side port are well within the stomach. Bowel gas pattern  unremarkable. Electronically Signed   By: Lowella Grip III M.D.   On: 04/22/2016 11:36   US Renal  Result Date: 05/03/2016 CLINICAL DATA:  Renal failure EXAM: RENAL ULTRASOUND COMPARISON:  None. FINDINGS: Right Kidney: Length: 11.8 cm. Echogenicity within normal limits. Renal cortical thickness is at the lower limits of normal. No perinephric fluid or hydronephrosis visualized. There is a cyst arising from the upper pole the right kidney measuring 3.2 x 3.5 x 3.5 cm. No sonographically demonstrable calculus or ureterectasis. Left Kidney: Length: 9.8 cm. Echogenicity within normal limits. Renal cortical thickness at lower limits of normal. No mass, perinephric fluid, or hydronephrosis visualized. No sonographically demonstrable calculus or ureterectasis. Bladder: Decompressed with Foley catheter and cannot be assessed. IMPRESSION: Cyst arising from upper pole right kidney. No obstructing foci on either side. Borderline size discrepancy between kidneys. Significance of this finding is uncertain. This finding potentially could indicate a degree of renal artery stenosis on the left. In this regard, question whether patient is hypertensive. Electronically Signed   By: Lowella Grip III M.D.   On: 05/05/2016 14:55   Mr Foot Right Wo Contrast  Result Date: 04/12/2016 CLINICAL DATA:  Right foot ulcers. EXAM: MRI OF THE RIGHT FOREFOOT WITHOUT CONTRAST TECHNIQUE: Multiplanar, multisequence MR imaging was performed. No intravenous contrast was administered. COMPARISON:  Radiographs 04/07/2016 FINDINGS: Large skin ulcer noted along the lateral and plantar aspect of the fifth metatarsal head. Extensive surrounding cellulitis and fluid collections worrisome for abscesses. There is also osteomyelitis involving the proximal phalanx of the fifth toe and also the fifth metatarsal head a possible small walled-off bone abscess. There is likely intervening septic arthritis and septic tenosynovitis. Diffuse cellulitis  and myofasciitis. No obvious changes of pyomyositis. The other bony structures are intact. I do not see any other definite sites of osteomyelitis. Moderate degenerative changes. IMPRESSION: 1. MR findings consistent with septic arthritis involving the fifth metatarsal phalangeal joint with osteomyelitis involving the fifth metatarsal head and fifth proximal phalanx. 2. Large open foot ulcer along the lateral aspect of the fifth metatarsal head with surrounding cellulitis. Fluid collections could suggest abscesses. 3. Diffuse cellulitis and myofasciitis involving the forefoot. Electronically Signed   By: Marijo Sanes M.D.   On: 04/12/2016 17:35    ASSESSMENT AND PLAN:   Active Problems:   Sepsis ([place])   Osteomyelitis of right foot (HCC)   Acute renal insufficiency   Leukocytosis   Pressure injury of skin   Cardiac arrest [facility])  # cardiac arrest- cardiogenic shock   No clear reason so far.   Cardio consult.   Now on vent, vasopressor support.   ICU care, I will sign off.  # Sepsis due to right foot osteomyelitis/cellulitis, admit patient to medical floor, blood cultures are taken, patient is initiated on broad-spectrum antibiotic therapy with Zosyn and vancomycin,   evaluated by podiatrist and done Amputation.   Also seen by vascular surgery- plan was for angioplasty on Monday, now need to decide again after cardiac arrest. # Osteomyelitis/cellulitis of right, continue broad-spectrum antibody therapy, get an MRSA PCR # Acute renal insufficiency, continue IV fluids,  follow creatinine in the morning   As he will need vascular procedure, will keep well hydrated. Slightly worsening in creatinine.   Nephrology consult. # Leukocytosis, follow with antibiotic therapy # Lactic acidosis, follow lactic acid level closely. Continue IV fluids, supportive therapy.  # sinus tachycardia, due to lactic acidosis, normal TSH, follow cardiac enzymes 3, continue metoprolol   All the records are reviewed  and case discussed with Care Management/Social Workerr. Management plans discussed with the patient, family and they are in agreement.  CODE STATUS: full  TOTAL TIME TAKING CARE OF THIS PATIENT: 35 critical care minutes.   POSSIBLE D/C IN 4-5  DAYS, DEPENDING ON CLINICAL CONDITION. Yolanda Bonine was updated on the code situation and then about prognosis.  Vaughan Basta M.D on 05/06/2016   Between 7am to 6pm - Pager - [phone]  After 6pm go to [email or web address] - password [email or web address] [facility]  Office  [phone]  CC: Primary care physician; Idelle Crouch, MD  Note: This dictation was prepared with Dragon dictation along with smaller phrase technology. Any transcriptional errors that result from this process are unintentional.

## 2016-04-13 NOTE — OR Nursing (Signed)
Code was called at 0859. Patient was sent to ICU after code

## 2016-04-13 NOTE — Procedures (Signed)
Arterial Line Placement: Indication: Frequent blood draws; Invasive BP monitoring.   Consent: Emergent.   Risks and benefits explained to patient and/or family in detail including risk of infection, bleeding, respiratory failure and death..   Hand washing performed prior to starting the procedure.   Procedure: An active timeout was performed and correct patient, name, & ID confirmed. Physicial exam was performed to ensure adequate perfusion.  Using sterile technique, an aterial line was inserted into the LEFT Femoral artery.  Catheter threaded and the needle was removed with appropriate blood return.  Arterial waveform was noted.  After the procedure, the patient's extremities were observed to be pink and warm.   Estimated Blood Loss: None .   Number of Attempts: 1.   Complications: None .  Operator: Mashell Sieben.   Corrin Parker, M.D.  [facility]  Medical Director [place] Director [facility] Cardio-Pulmonary Department

## 2016-04-13 NOTE — Progress Notes (Addendum)
eLink Physician-Brief Progress Note Patient Name: Troy Orr DOB: May 15, 1922 MRN: [id number]   Date of Service  05/02/2016  HPI/Events of Note  Lactic Acid level = 5.4 >> 5.7. Hgb = 13.8. BP = 101/64.  eICU Interventions  Will order: 1. 0.9 NaCl 500 mL IV over 30 minutes now. 1. Monitor CVP. Will use CVP reading to direct furthre management.      Intervention Category Major Interventions: Acid-Base disturbance - evaluation and management  Sommer,Steven Eugene 04/23/2016, 5:58 PM

## 2016-04-13 NOTE — Progress Notes (Signed)
RN called [facility] and asked to speak to Dr. Emmit Alexanders but MD busy on other line therefore this RN made the [facility] RN aware of CVP fluctuating 9-12 mmHg and asked her to relay message to Dr. Emmit Alexanders.

## 2016-04-13 NOTE — Progress Notes (Signed)
eLink Physician-Brief Progress Note Patient Name: Troy Orr DOB: 12/30/1921 MRN: [id number]   Date of Service  04/14/2016  HPI/Events of Note  CVP = 9-12.  eICU Interventions  Will recheck Lactic Acid X 1 at 9 PM.     Intervention Category Major Interventions: Acid-Base disturbance - evaluation and management  Asmaa Tirpak Eugene 04/08/2016, 6:39 PM

## 2016-04-14 ENCOUNTER — Inpatient Hospital Stay: Payer: Medicare Other

## 2016-04-14 ENCOUNTER — Encounter: Payer: Self-pay | Admitting: Podiatry

## 2016-04-14 DIAGNOSIS — E11628 Type 2 diabetes mellitus with other skin complications: Secondary | ICD-10-CM

## 2016-04-14 DIAGNOSIS — L089 Local infection of the skin and subcutaneous tissue, unspecified: Secondary | ICD-10-CM

## 2016-04-14 LAB — CBC
HCT: 36.7 % — ABNORMAL LOW (ref 40.0–52.0)
Hemoglobin: 11.9 g/dL — ABNORMAL LOW (ref 13.0–18.0)
MCH: 31.9 pg (ref 26.0–34.0)
MCHC: 32.6 g/dL (ref 32.0–36.0)
MCV: 97.8 fL (ref 80.0–100.0)
PLATELETS: 123 10*3/uL — AB (ref 150–440)
RBC: 3.75 MIL/uL — AB (ref 4.40–5.90)
RDW: 15.7 % — ABNORMAL HIGH (ref 11.5–14.5)
WBC: 17.4 10*3/uL — ABNORMAL HIGH (ref 3.8–10.6)

## 2016-04-14 LAB — BLOOD GAS, ARTERIAL
ACID-BASE DEFICIT: 9.8 mmol/L — AB (ref 0.0–2.0)
BICARBONATE: 14.6 mmol/L — AB (ref 20.0–28.0)
O2 Saturation: 99.3 %
PCO2 ART: 27 mmHg — AB (ref 32.0–48.0)
PH ART: 7.34 — AB (ref 7.350–7.450)
Patient temperature: 37
pO2, Arterial: 160 mmHg — ABNORMAL HIGH (ref 83.0–108.0)

## 2016-04-14 LAB — GLUCOSE, CAPILLARY
GLUCOSE-CAPILLARY: 168 mg/dL — AB (ref 65–99)
GLUCOSE-CAPILLARY: 144 mg/dL — AB (ref 65–99)
GLUCOSE-CAPILLARY: 138 mg/dL — AB (ref 65–99)
GLUCOSE-CAPILLARY: 136 mg/dL — AB (ref 65–99)
Glucose-Capillary: 131 mg/dL — ABNORMAL HIGH (ref 65–99)

## 2016-04-14 LAB — BASIC METABOLIC PANEL
Anion gap: 11 (ref 5–15)
BUN: 50 mg/dL — AB (ref 6–20)
CO2: 16 mmol/L — ABNORMAL LOW (ref 22–32)
CREATININE: 2.48 mg/dL — AB (ref 0.61–1.24)
Calcium: 7.3 mg/dL — ABNORMAL LOW (ref 8.9–10.3)
Chloride: 113 mmol/L — ABNORMAL HIGH (ref 101–111)
GFR calc Af Amer: 24 mL/min — ABNORMAL LOW (ref >60)
GFR, EST NON AFRICAN AMERICAN: 21 mL/min — AB (ref >60)
GLUCOSE: 178 mg/dL — AB (ref 65–99)
POTASSIUM: 4.3 mmol/L (ref 3.5–5.1)
Sodium: 140 mmol/L (ref 135–145)

## 2016-04-14 LAB — CULTURE, BLOOD (ROUTINE X 2)
CULTURE: NO GROWTH
Culture: NO GROWTH

## 2016-04-14 LAB — AEROBIC CULTURE W GRAM STAIN (SUPERFICIAL SPECIMEN)

## 2016-04-14 LAB — MAGNESIUM: Magnesium: 1.8 mg/dL (ref 1.7–2.4)

## 2016-04-14 LAB — AEROBIC CULTURE  (SUPERFICIAL SPECIMEN)

## 2016-04-14 LAB — PHOSPHORUS: PHOSPHORUS: 7 mg/dL — AB (ref 2.5–4.6)

## 2016-04-14 MED ORDER — VITAL HIGH PROTEIN PO LIQD
1000.0000 mL | ORAL | Status: DC
  Administered 2016-04-15 (×5)
  Administered 2016-04-15: 1000 mL
  Administered 2016-04-15: 05:00:00

## 2016-04-14 MED ORDER — PRO-STAT SUGAR FREE PO LIQD
30.0000 mL | Freq: Two times a day (BID) | ORAL | Status: DC
  Administered 2016-04-15: 30 mL

## 2016-04-14 MED ORDER — FAMOTIDINE 40 MG/5ML PO SUSR
20.0000 mg | Freq: Every day | ORAL | Status: DC
  Administered 2016-04-14 – 2016-04-15 (×2): 20 mg
  Filled 2016-04-14 (×2): qty 2.5

## 2016-04-14 MED ORDER — PIPERACILLIN-TAZOBACTAM 3.375 G IVPB
3.3750 g | Freq: Two times a day (BID) | INTRAVENOUS | Status: DC
  Administered 2016-04-15: 3.375 g via INTRAVENOUS
  Filled 2016-04-14 (×2): qty 50

## 2016-04-14 NOTE — Progress Notes (Signed)
Patient Demographics  Troy Orr, is a 82 y.o. male   MRN: [id number]   DOB - 09-05-1921  Admit Date - 05/06/2016    Outpatient Primary MD for the patient is SPARKS,JEFFREY D, MD  Consult requested in the Hospital by Flora Lipps, MD, On 04/14/2016   With History of -  Past Medical History:  Diagnosis Date  . Arthritis   . Cancer [facility])    Colon   . Diabetes ([place])   . Heart trouble       Past Surgical History:  Procedure Laterality Date  . heart bypass      in for   Chief Complaint  Patient presents with  . Foot Pain    Right     HPI  Troy Orr  is a 82 y.o. male, Underwent surgery yesterday for fifth ray amputation secondary to osteomyelitis but coated during the procedure was revived and placed in the CCU. I was unable to completely finish the procedure but was able to do the fifth ray amputation but was not able to close it completely. I did pack the area with antibiotic beads and closed it partially. There is Mepitel over the wound holding things in place.    Prior to Admission medications   Medication Sig Start Date End Date Taking? Authorizing Provider  acetaminophen (TYLENOL) 325 MG tablet Take 650 mg by mouth every 6 (six) hours as needed.   Yes Historical Provider, MD  aspirin 81 MG tablet Take 81 mg by mouth daily.   Yes Historical Provider, MD  Calcium Carbonate Antacid (TUMS E-X SUGAR FREE PO) Take 1 tablet by mouth every 4 (four) hours as needed.   Yes Historical Provider, MD  carbamide peroxide (DEBROX) 6.5 % otic solution Place 5 drops into both ears 2 (two) times daily as needed.   Yes Historical Provider, MD  diphenoxylate-atropine (LOMOTIL) 2.5-0.025 MG tablet Take 1 tablet by mouth as needed for diarrhea or loose stools.   Yes Historical Provider, MD  guaifenesin  (DIABETIC TUSSIN EX) 100 MG/5ML syrup Take 200 mg by mouth every 4 (four) hours as needed for cough.   Yes Historical Provider, MD  hydrOXYzine (ATARAX/VISTARIL) 25 MG tablet Take 25 mg by mouth every 6 (six) hours as needed.   Yes Historical Provider, MD  metFORMIN (GLUCOPHAGE) 1000 MG tablet Take 500 mg by mouth 3 (three) times daily.    Yes Historical Provider, MD  metoprolol succinate (TOPROL-XL) 50 MG 24 hr tablet  11/16/13  Yes Historical Provider, MD  Multiple Vitamin (DAILY VITE) TABS Take 1 tablet by mouth daily.   Yes Historical Provider, MD  omeprazole (PRILOSEC) 20 MG capsule Take 20 mg by mouth daily.   Yes Historical Provider, MD  ondansetron (ZOFRAN) 4 MG tablet Take 4 mg by mouth every 8 (eight) hours as needed for nausea or vomiting.   Yes Historical Provider, MD  polycarbophil (FIBERCON) 625 MG tablet Take 625 mg by mouth 2 (two) times daily.   Yes Historical Provider, MD  polyethylene glycol (MIRALAX / GLYCOLAX) packet Take 17 g by mouth daily.   Yes Historical Provider, MD  simethicone (MYLICON) 841 MG chewable tablet Chew 125 mg by mouth 4 (four) times  daily -  before meals and at bedtime.   Yes Historical Provider, MD  torsemide (DEMADEX) 20 MG tablet Take 20 mg by mouth once.  11/30/13  Yes Historical Provider, MD  traZODone (DESYREL) 50 MG tablet Take 50 mg by mouth at bedtime.   Yes Historical Provider, MD  vitamin B-12 (CYANOCOBALAMIN) 1000 MCG tablet Take 1,000 mcg by mouth daily.   Yes Historical Provider, MD    Anti-infectives    Start     Dose/Rate Route Frequency Ordered Stop   04/15/2016 1121  vancomycin (VANCOCIN) IVPB 1000 mg/200 mL premix  Status:  Discontinued     1,000 mg 200 mL/hr over 60 Minutes Intravenous Every 36 hours 04/12/16 1237 04/14/16 1101   04/10/16 0000  vancomycin (VANCOCIN) IVPB 1000 mg/200 mL premix  Status:  Discontinued     1,000 mg 200 mL/hr over 60 Minutes Intravenous Every 24 hours 04/06/2016 1926 04/12/16 1237   05/06/2016 1924   piperacillin-tazobactam (ZOSYN) IVPB 3.375 g     3.375 g 12.5 mL/hr over 240 Minutes Intravenous Every 8 hours 04/12/2016 1926     04/11/2016 1700  piperacillin-tazobactam (ZOSYN) IVPB 3.375 g     3.375 g 100 mL/hr over 30 Minutes Intravenous  Once 04/11/2016 1649 04/22/2016 1737   04/22/2016 1700  vancomycin (VANCOCIN) IVPB 1000 mg/200 mL premix     1,000 mg 200 mL/hr over 60 Minutes Intravenous  Once 05/05/2016 1649 05/04/2016 1807      Scheduled Meds: . chlorhexidine gluconate (MEDLINE KIT)  15 mL Mouth Rinse BID  . famotidine  20 mg Per Tube Daily  . heparin  5,000 Units Subcutaneous Q8H  . insulin aspart  0-5 Units Subcutaneous QHS  . insulin aspart  0-9 Units Subcutaneous TID WC  . mouth rinse  15 mL Mouth Rinse 10 times per day  . piperacillin-tazobactam (ZOSYN)  IV  3.375 g Intravenous Q8H  . sodium chloride flush  10-40 mL Intracatheter Q12H  . sodium chloride flush  3 mL Intravenous Q12H   Continuous Infusions: . sodium chloride 100 mL/hr at 04/14/16 1111  . norepinephrine (LEVOPHED) Adult infusion 2.987 mcg/min (04/14/16 0600)   PRN Meds:.sodium chloride, sodium chloride, acetaminophen, fentaNYL (SUBLIMAZE) injection, HYDROcodone-acetaminophen, meclizine, ondansetron **OR** ondansetron (ZOFRAN) IV, ondansetron (ZOFRAN) IV, sodium chloride flush, sodium chloride flush  No Known Allergies  Physical Exam  Vitals  Blood pressure (!) 95/58, pulse 77, temperature 98.2 F (36.8 C), resp. rate 18, height _0  (1.753 m), weight 83.2 kg (183 lb 6.8 oz), SpO2 100 %.  Lower Extremity exam:Dressing is intact and stable to the right foot. They did have to reinforce it a couple times in night due to bleeding. But overall he is stable with at this juncture.  Data Review  CBC  Recent Labs Lab 04/15/2016 1622 04/24/2016 2222 04/10/16 0428 04/14/2016 1147 04/14/16 0402  WBC 15.6* 11.8* 9.4 26.0* 17.4*  HGB 13.2 12.2* 12.0* 13.8 11.9*  HCT 39.4* 37.1* 35.7* 43.6 36.7*  PLT 164 137* 134* 175  123*  MCV 96.8 96.4 96.8 101.1* 97.8  MCH 32.4 31.8 32.7 31.9 31.9  MCHC 33.5 33.0 33.7 31.6* 32.6  RDW 15.6* 15.5* 15.6* 16.3* 15.7*  LYMPHSABS 1.5  --   --   --   --   MONOABS 1.2*  --   --   --   --   EOSABS 0.0  --   --   --   --   BASOSABS 0.1  --   --   --   --    ------------------------------------------------------------------------------------------------------------------  Chemistries   Recent Labs Lab 05/04/2016 1622  04/10/16 0428 04/11/16 0358 04/12/16 0345 04/25/2016 0509 04/24/2016 1147 04/14/16 0402  NA 135  --  138 139  --  139 139 140  K 4.6  --  4.0 4.0  --  4.2 4.4 4.3  CL 102  --  109 109  --  109 109 113*  CO2 22  --  23 19*  --  20* 15* 16*  GLUCOSE 182*  --  191* 96  --  134* 111* 178*  BUN 35*  --  32* 30*  --  39* 39* 50*  CREATININE 1.41*  < > 1.20 1.36* 1.49* 1.67* 1.73* 2.48*  CALCIUM 9.0  --  8.0* 8.3*  --  8.3* 8.2* 7.3*  MG  --   --   --   --   --   --   --  1.8  AST 25  --   --   --   --   --   --   --   ALT 20  --   --   --   --   --   --   --   ALKPHOS 79  --   --   --   --   --   --   --   BILITOT 0.4  --   --   --   --   --   --   --   < > = values in this interval not displayed. ------------------------------------------------------------------------------------------------------------------ estimated creatinine clearance is 19 mL/min (by C-G formula based on SCr of 2.48 mg/dL (H)). ------------------------------------------------------------------------------------------------------------------ No results for input(s): TSH, T4TOTAL, T3FREE, THYROIDAB in the last 72 hours.  Invalid input(s): FREET3  Imaging results:   Dg Chest 1 View  Result Date: 05/06/2016 CLINICAL DATA:  Hypoxia following cardiac arrest EXAM: CHEST 1 VIEW COMPARISON:  April 09, 2016 FINDINGS: Endotracheal tube tip is 6.6 cm above the carina. Central catheter tip is in the superior vena cava. Nasogastric tube tip and side port in proximal stomach. No pneumothorax.  There is no edema or consolidation. Heart is mildly enlarged with pulmonary vascularity within normal limits. Prosthetic cardiac valve noted. No adenopathy evident. There is calcification in the aortic arch. IMPRESSION: Tube and catheter positions as described without pneumothorax. No edema or consolidation. Mild cardiac enlargement. Aortic atherosclerosis. Electronically Signed   By: Lowella Grip III M.D.   On: 04/12/2016 11:35   Dg Abd 1 View  Result Date: 04/12/2016 CLINICAL DATA:  Cardiac arrest with orogastric tube placement EXAM: ABDOMEN - 1 VIEW COMPARISON:  None. FINDINGS: For gastric tube tip and side port in stomach. Note that the side port of the tube is immediately distal to the gastroesophageal junction. Bowel gas pattern is normal.  No bowel obstruction or free air. IMPRESSION: Orogastric tube tip and side-port in its proximal stomach. It may be prudent to advance the nasogastric tube 4-5 cm to insure that both the tip and side port are well within the stomach. Bowel gas pattern unremarkable. Electronically Signed   By: Lowella Grip III M.D.   On: 04/20/2016 11:36   US Renal  Result Date: 05/02/2016 CLINICAL DATA:  Renal failure EXAM: RENAL ULTRASOUND COMPARISON:  None. FINDINGS: Right Kidney: Length: 11.8 cm. Echogenicity within normal limits. Renal cortical thickness is at the lower limits of normal. No perinephric fluid or hydronephrosis visualized. There is a cyst arising from the upper pole the right kidney measuring 3.2 x 3.5 x 3.5  cm. No sonographically demonstrable calculus or ureterectasis. Left Kidney: Length: 9.8 cm. Echogenicity within normal limits. Renal cortical thickness at lower limits of normal. No mass, perinephric fluid, or hydronephrosis visualized. No sonographically demonstrable calculus or ureterectasis. Bladder: Decompressed with Foley catheter and cannot be assessed. IMPRESSION: Cyst arising from upper pole right kidney. No obstructing foci on either side.  Borderline size discrepancy between kidneys. Significance of this finding is uncertain. This finding potentially could indicate a degree of renal artery stenosis on the left. In this regard, question whether patient is hypertensive. Electronically Signed   By: Lowella Grip III M.D.   On: 05/02/2016 14:55   Mr Foot Right Wo Contrast  Result Date: 04/12/2016 CLINICAL DATA:  Right foot ulcers. EXAM: MRI OF THE RIGHT FOREFOOT WITHOUT CONTRAST TECHNIQUE: Multiplanar, multisequence MR imaging was performed. No intravenous contrast was administered. COMPARISON:  Radiographs 04/12/2016 FINDINGS: Large skin ulcer noted along the lateral and plantar aspect of the fifth metatarsal head. Extensive surrounding cellulitis and fluid collections worrisome for abscesses. There is also osteomyelitis involving the proximal phalanx of the fifth toe and also the fifth metatarsal head a possible small walled-off bone abscess. There is likely intervening septic arthritis and septic tenosynovitis. Diffuse cellulitis and myofasciitis. No obvious changes of pyomyositis. The other bony structures are intact. I do not see any other definite sites of osteomyelitis. Moderate degenerative changes. IMPRESSION: 1. MR findings consistent with septic arthritis involving the fifth metatarsal phalangeal joint with osteomyelitis involving the fifth metatarsal head and fifth proximal phalanx. 2. Large open foot ulcer along the lateral aspect of the fifth metatarsal head with surrounding cellulitis. Fluid collections could suggest abscesses. 3. Diffuse cellulitis and myofasciitis involving the forefoot. Electronically Signed   By: Marijo Sanes M.D.   On: 04/12/2016 17:35   Dg Chest Port 1 View  Result Date: 04/14/2016 CLINICAL DATA:  Sepsis, osteomyelitis of the right foot, acute renal insufficiency, status post cardiac arrest. EXAM: PORTABLE CHEST 1 VIEW COMPARISON:  Portable chest x-ray of April 13, 2016 FINDINGS: The lungs are adequately  inflated. There is subsegmental atelectasis at the left lung base medially. There is no significant pleural effusion and no pneumothorax. The cardiac silhouette remains enlarged. The central pulmonary vascularity is mildly prominent. There is calcification in the wall of the aortic arch. There are post CABG changes which are stable. There is density that projects over the anterior aspect of the first rib which likely reflects a confluence of normal densities. The endotracheal tube tip lies 6.2 cm above the carina. The esophagogastric tube tip projects below the inferior margin of the image. The left internal jugular venous catheter tip projects over the midportion of the SVC. The bony structures exhibit no acute abnormality. IMPRESSION: Persistent subsegmental atelectasis in the left lower lobe. Cardiomegaly without pulmonary edema. The support tubes are in reasonable position. Aortic atherosclerosis. Electronically Signed   By: David  Martinique M.D.   On: 04/14/2016 07:24   Assessment & Plan:: Patient was a little more alert today. He is able to open his eyes and respond to voice. He is unable to communicate at this juncture. He is still on the ventilator. Foot appears to be stable. I'll leave the dressing intact at this timeframe. Dr. Vickki Muff will be following him for the rest of the week.  Active Problems:   Sepsis ([place])   Osteomyelitis of right foot ([place])   Acute renal insufficiency   Leukocytosis   Pressure injury of skin   Cardiac arrest ([place])   Valon Glasscock  G M.D on 04/14/2016 at 11:15 AM  Thank you for the consult, we will follow the patient with you in the Hospital.

## 2016-04-14 NOTE — Progress Notes (Addendum)
Pharmacy Antibiotic Note  Troy Orr is a 82 y.o. male admitted on 04/27/2016 with Sepsis due to right foot osteomyelitis/cellulitis.  Pharmacy has been consulted for Vancomycin and Zosyn dosing.   Plan: After discussion with Dr. Ashby Dawes, cultures growing Morganella and Proteus. D/C Vancomycin.   Zosyn 3.375 g EI q 12 hours. F/U deescalate to CTX.   Height: 5\' 9"  (175.3 cm) Weight: 183 lb 6.8 oz (83.2 kg) IBW/kg (Calculated) : 70.7  Temp (24hrs), Avg:98 F (36.7 C), Min:97 F (36.1 C), Max:98.8 F (37.1 C)   Recent Labs Lab 04/13/2016 1622 04/18/2016 1926 04/26/2016 2222 04/10/16 0428 04/11/16 0358 04/11/16 2209 04/12/16 0345 04/20/2016 0509 05/01/2016 1147 05/03/2016 1710 04/07/2016 2136 04/14/16 0402  WBC 15.6*  --  11.8* 9.4  --   --   --   --  26.0*  --   --  17.4*  CREATININE 1.41*  --  1.18 1.20 1.36*  --  1.49* 1.67* 1.73*  --   --  2.48*  LATICACIDVEN 4.0* 2.0* 1.7  --   --   --   --   --  5.4* 5.7* 3.1*  --   VANCOTROUGH  --   --   --   --   --  19  --   --   --   --   --   --     Estimated Creatinine Clearance: 19 mL/min (by C-G formula based on SCr of 2.48 mg/dL (H)).    No Known Allergies  Antimicrobials this admission: 10/4 Zosyn >>  10/4 Vancomycin >> 10/9   Microbiology results: 10/4 BCx: NG 10/4 Wound Cx: Morganella/Proteus 10/4 MRSA PCR: negative  Thank you for allowing pharmacy to be a part of this patient's care.  Ulice Dash, PharmD Clinical Pharmacist  04/14/2016 4:21 PM

## 2016-04-14 NOTE — Progress Notes (Signed)
Initial Nutrition Assessment  DOCUMENTATION CODES:   Not applicable  INTERVENTION:  -If unable to extubate within 24-48 hours, recommend begin tube feeding via OGT with Vital 1.2 goal rate of 25mL/hr, provides 1728 calories, 108gm protein, and 1168cc free water  NUTRITION DIAGNOSIS:   Inadequate oral intake related to inability to eat as evidenced by NPO status.  GOAL:   Patient will meet greater than or equal to 90% of their needs  MONITOR:   Vent status, Labs, Weight trends, I & O's, TF tolerance  REASON FOR ASSESSMENT:   Ventilator    ASSESSMENT:   Troy Orr  is a 82 y.o. male with a known history of Arthritis, colon cancer, diabetes mellitus, coronary artery disease, status post cardiac bypass, who presents to the hospital with complaints of right lower extremity redness for the past one or 2 days  Patient is currently intubated on ventilator support MV: 8.8 L/min Temp (24hrs), Avg:97.7 F (36.5 C), Min:96.6 F (35.9 C), Max:98.8 F (37.1 C) Propofol: none Pt was undergoing amputation of metatarsal and coded upon induction of anesthesia. CPR was performed for 20 mins. Weaning some. Hypotensive Labs and medications reviewed: Phos 7.0 Levo Drip, NS @ 119mL/hr  Diet Order:  Diet NPO time specified  Skin:  Reviewed, no issues  Last BM:  10/7  Height:   Ht Readings from Last 1 Encounters:  04/08/2016 5\' 9"  (1.753 m)    Weight:   Wt Readings from Last 1 Encounters:  04/14/16 183 lb 6.8 oz (83.2 kg)    Ideal Body Weight:  72.72 kg  BMI:  Body mass index is 27.09 kg/m.  Estimated Nutritional Needs:   Kcal:  1677 calories  Protein:  100-125 gm  Fluid:  >/= 1.7L  EDUCATION NEEDS:   No education needs identified at this time  Satira Anis. Kaitlynd Phillips, MS, RD LDN Inpatient Clinical Dietitian Pager [phone]

## 2016-04-14 NOTE — Care Management (Signed)
on 04/25/2016, Patient was to undergo amputation of metatarsal and had cardiac arrest upon induction of anesthesia.  CPR performed for approximately 20 minutes.  Has arterial line.  Is now a DNR.  No dialysis will be initiated.  Currently intubated and on vent.  Multi organ failure

## 2016-04-14 NOTE — Progress Notes (Signed)
Advance Care Planning Note:  Discussed case with pt's son via phone.  I explained the patient has experienced a progressive downward course, family agrees and thinks that he would not want to be maintained on the ventilator.   The patient's son and his son (pt's grandson) are main Garment/textile technologist. There is another grandson who is not really involved in the patients life. There is another son, but he lives in [place] and will not be able to make it. The patient himself is widowed.   Family lives about 3 hours away, they agreed that they would like to try to come in today evening or tomorrow, they think they will most likely lean towards terminal extubation, but they would like to see him first, in the meantime they agree to continued DNR.   They will inform the RN on their arrival as well as whether/when they would like to initiate a terminal wean.    Marda Stalker, M.D.  Time spent in discussion 30 min.

## 2016-04-14 NOTE — Progress Notes (Signed)
[facility]  Daily Progress Note   Subjective  - 1 Day Post-Op  Remains intubated, hypotensive.  Unable to provide history  Objective Vitals:   04/14/16 0600 04/14/16 0700 04/14/16 0730 04/14/16 0800  BP: (!) 92/56   (!) 95/58  Pulse: 79 79 79 77  Resp: 18 17 17 18   Temp: 98.4 F (36.9 C) 98.2 F (36.8 C) 98.2 F (36.8 C) 98.2 F (36.8 C)  TempSrc:  Other (Comment)    SpO2: 100% 100% 100% 100%  Weight:      Height:        Intake/Output Summary (Last 24 hours) at 04/14/16 1008 Last data filed at 04/14/16 0800  Gross per 24 hour  Intake          3952.97 ml  Output                5 ml  Net          3947.97 ml    PULM  Coarse BS bilaterally, on vent CV  RRR VASC  Right foot dressed  Laboratory CBC    Component Value Date/Time   WBC 17.4 (H) 04/14/2016 0402   HGB 11.9 (L) 04/14/2016 0402   HGB 11.2 (L) 01/30/2013 0418   HCT 36.7 (L) 04/14/2016 0402   HCT 34.5 (L) 01/30/2013 0418   PLT 123 (L) 04/14/2016 0402   PLT 82 (L) 01/30/2013 0418    BMET    Component Value Date/Time   NA 140 04/14/2016 0402   NA 137 01/30/2013 0418   K 4.3 04/14/2016 0402   K 4.2 01/30/2013 0418   CL 113 (H) 04/14/2016 0402   CL 106 01/30/2013 0418   CO2 16 (L) 04/14/2016 0402   CO2 28 01/30/2013 0418   GLUCOSE 178 (H) 04/14/2016 0402   GLUCOSE 93 01/30/2013 0418   BUN 50 (H) 04/14/2016 0402   BUN 10 01/30/2013 0418   CREATININE 2.48 (H) 04/14/2016 0402   CREATININE 0.95 01/30/2013 0418   CALCIUM 7.3 (L) 04/14/2016 0402   CALCIUM 8.1 (L) 01/30/2013 0418   GFRNONAA 21 (L) 04/14/2016 0402   GFRNONAA >60 01/30/2013 0418   GFRAA 24 (L) 04/14/2016 0402   GFRAA >60 01/30/2013 0418    Assessment/Planning:    PAD with ulceration right foot.  Not stable for angiogram at this point.  Will see whether or not he recovers and determine if procedure can be safely done if he extubates.  No other vascular recs at this time.    Jason Dew  04/14/2016, 10:08  AM

## 2016-04-14 NOTE — Progress Notes (Signed)
RT responded to code blue called in OR 8.  Upon RT arrival, OR staff already performing CPR.  RT on standby until told all clear to leave OR after patient regained rhythm following several minutes of CPR.

## 2016-04-14 NOTE — Progress Notes (Signed)
Provided Pastoral presence and a silent prayer. Troy Orr    04/14/16 1010  Clinical Encounter Type  Visited With Patient not available;Other (Comment) (Silent Prayer)  Visit Type Initial;Spiritual support  Referral From Nurse  Spiritual Encounters  Spiritual Needs Prayer

## 2016-04-14 NOTE — Progress Notes (Signed)
PULMONARY / CRITICAL CARE MEDICINE   Name: BROADUS COSTILLA MRN: [id number] DOB: [birth year]    ADMISSION DATE:  05/06/2016  Subjective.  Remains on vent, sedated.    REVIEW OF SYSTEMS:   Can not obtain due to critical illness    VITAL SIGNS: Temp:  [93.4 F (34.1 C)-98.8 F (37.1 C)] 98.2 F (36.8 C) (10/09 0800) Pulse Rate:  [74-90] 77 (10/09 0800) Resp:  [16-29] 18 (10/09 0800) BP: (84-132)/(47-97) 95/58 (10/09 0800) SpO2:  [58 %-100 %] 100 % (10/09 0800) Arterial Line BP: (68-147)/(48-83) 116/54 (10/09 0800) FiO2 (%):  [40 %-100 %] 40 % (10/09 0600) Weight:  [176 lb 5.9 oz (80 kg)-183 lb 6.8 oz (83.2 kg)] 183 lb 6.8 oz (83.2 kg) (10/09 0406) HEMODYNAMICS: CVP:  [9 mmHg-15 mmHg] 15 mmHg VENTILATOR SETTINGS: Vent Mode: PRVC FiO2 (%):  [40 %-100 %] 40 % Set Rate:  [16 bmp] 16 bmp Vt Set:  [550 mL] 550 mL PEEP:  [5 GBE01-00 cmH20] 5 cmH20 Plateau Pressure:  [18 cmH20-22 cmH20] 18 cmH20 INTAKE / OUTPUT:  Intake/Output Summary (Last 24 hours) at 04/14/16 0858 Last data filed at 04/14/16 0800  Gross per 24 hour  Intake          5055.47 ml  Output               10 ml  Net          5045.47 ml    Physical Examination:   GENERAL:critically ill appearing, +resp distress, unresponsive.  HEAD: Normocephalic, atraumatic.  EYES: Pupils equal, round, reactive to light.  No scleral icterus.  MOUTH: Moist mucosal membrane. NECK: Supple. No thyromegaly. No nodules. No JVD. c collar in place PULMONARY: Diffuse coarse rhonchi right sided +wheezes +rhonchi CARDIOVASCULAR: S1 and S2. Regular rate and rhythm. No murmurs, rubs, or gallops.  GASTROINTESTINAL: Soft, nontender, +distended. No masses. Positive bowel sounds. No hepatosplenomegaly.  MUSCULOSKELETAL: No swelling, clubbing, or edema.  NEUROLOGIC: GCS<8T SKIN:intact,warm,dry    LABS:  CBC  Recent Labs Lab 04/10/16 0428 04/16/2016 1147 04/14/16 0402  WBC 9.4 26.0* 17.4*  HGB 12.0* 13.8 11.9*  HCT 35.7* 43.6  36.7*  PLT 134* 175 123*   Coag's No results for input(s): APTT, INR in the last 168 hours. BMET  Recent Labs Lab 04/20/2016 0509 04/12/2016 1147 04/14/16 0402  NA 139 139 140  K 4.2 4.4 4.3  CL 109 109 113*  CO2 20* 15* 16*  BUN 39* 39* 50*  CREATININE 1.67* 1.73* 2.48*  GLUCOSE 134* 111* 178*   Electrolytes  Recent Labs Lab 04/19/2016 0509 05/01/2016 1147 04/14/16 0402  CALCIUM 8.3* 8.2* 7.3*  MG  --   --  1.8  PHOS  --   --  7.0*   Sepsis Markers  Recent Labs Lab 04/21/2016 1147 04/20/2016 1710 04/06/2016 2136  LATICACIDVEN 5.4* 5.7* 3.1*   ABG  Recent Labs Lab 04/27/2016 1130 04/14/16 0543  PHART 7.19* 7.34*  PCO2ART 28* 27*  PO2ART 384* 160*   Liver Enzymes  Recent Labs Lab 04/11/2016 1622  AST 25  ALT 20  ALKPHOS 79  BILITOT 0.4  ALBUMIN 3.4*   Cardiac Enzymes  Recent Labs Lab 04/10/16 0428 04/10/16 1006 04/19/2016 1147  TROPONINI 0.15* 0.20* 0.13*   Glucose  Recent Labs Lab 04/12/16 2132 05/05/2016 0720 04/06/2016 0958 04/10/2016 1625 05/03/2016 2138 04/14/16 0724  GLUCAP 147* 123* 116* 142* 172* 168*    Imaging Dg Chest 1 View  Result Date: 04/12/2016 CLINICAL DATA:  Hypoxia following cardiac  arrest EXAM: CHEST 1 VIEW COMPARISON:  April 09, 2016 FINDINGS: Endotracheal tube tip is 6.6 cm above the carina. Central catheter tip is in the superior vena cava. Nasogastric tube tip and side port in proximal stomach. No pneumothorax. There is no edema or consolidation. Heart is mildly enlarged with pulmonary vascularity within normal limits. Prosthetic cardiac valve noted. No adenopathy evident. There is calcification in the aortic arch. IMPRESSION: Tube and catheter positions as described without pneumothorax. No edema or consolidation. Mild cardiac enlargement. Aortic atherosclerosis. Electronically Signed   By: Lowella Grip III M.D.   On: 04/08/2016 11:35   Dg Abd 1 View  Result Date: 05/06/2016 CLINICAL DATA:  Cardiac arrest with orogastric tube  placement EXAM: ABDOMEN - 1 VIEW COMPARISON:  None. FINDINGS: For gastric tube tip and side port in stomach. Note that the side port of the tube is immediately distal to the gastroesophageal junction. Bowel gas pattern is normal.  No bowel obstruction or free air. IMPRESSION: Orogastric tube tip and side-port in its proximal stomach. It may be prudent to advance the nasogastric tube 4-5 cm to insure that both the tip and side port are well within the stomach. Bowel gas pattern unremarkable. Electronically Signed   By: Lowella Grip III M.D.   On: 05/02/2016 11:36   US Renal  Result Date: 04/10/2016 CLINICAL DATA:  Renal failure EXAM: RENAL ULTRASOUND COMPARISON:  None. FINDINGS: Right Kidney: Length: 11.8 cm. Echogenicity within normal limits. Renal cortical thickness is at the lower limits of normal. No perinephric fluid or hydronephrosis visualized. There is a cyst arising from the upper pole the right kidney measuring 3.2 x 3.5 x 3.5 cm. No sonographically demonstrable calculus or ureterectasis. Left Kidney: Length: 9.8 cm. Echogenicity within normal limits. Renal cortical thickness at lower limits of normal. No mass, perinephric fluid, or hydronephrosis visualized. No sonographically demonstrable calculus or ureterectasis. Bladder: Decompressed with Foley catheter and cannot be assessed. IMPRESSION: Cyst arising from upper pole right kidney. No obstructing foci on either side. Borderline size discrepancy between kidneys. Significance of this finding is uncertain. This finding potentially could indicate a degree of renal artery stenosis on the left. In this regard, question whether patient is hypertensive. Electronically Signed   By: Lowella Grip III M.D.   On: 04/12/2016 14:55   Dg Chest Port 1 View  Result Date: 04/14/2016 CLINICAL DATA:  Sepsis, osteomyelitis of the right foot, acute renal insufficiency, status post cardiac arrest. EXAM: PORTABLE CHEST 1 VIEW COMPARISON:  Portable chest x-ray  of April 13, 2016 FINDINGS: The lungs are adequately inflated. There is subsegmental atelectasis at the left lung base medially. There is no significant pleural effusion and no pneumothorax. The cardiac silhouette remains enlarged. The central pulmonary vascularity is mildly prominent. There is calcification in the wall of the aortic arch. There are post CABG changes which are stable. There is density that projects over the anterior aspect of the first rib which likely reflects a confluence of normal densities. The endotracheal tube tip lies 6.2 cm above the carina. The esophagogastric tube tip projects below the inferior margin of the image. The left internal jugular venous catheter tip projects over the midportion of the SVC. The bony structures exhibit no acute abnormality. IMPRESSION: Persistent subsegmental atelectasis in the left lower lobe. Cardiomegaly without pulmonary edema. The support tubes are in reasonable position. Aortic atherosclerosis. Electronically Signed   By: David  Martinique M.D.   On: 04/14/2016 07:24   82 yo white male admitted to ICU for  acute cardiac arrest likley from acute MI with underlying CAD with RT foot osetyo s/p amputation with underlying acute renal failure    PULMONARY -Respiratory Failure -continue Full MV support -continue Bronchodilator Therapy -Wean Fio2 and PEEP as tolerated -will perform SAT/SBt when respiratory parameters are met   CARDIOVASCULAR Shock-cardiogenic- Vasopressors to keep MAP>65, wean down as tolerated.    RENAL Watch UO  foley catheter  GASTROINTESTINAL Npo for now  HEMATOLOGIC Follow CBC  INFECTIOUS Rt foot osteo- -continue iv abx   NEUROLOGIC - intubated and sedated - minimal sedation to achieve a RASS goal: -1   Deep Ashby Dawes, M.D.  04/14/2016   Critical Care Attestation.  I have personally obtained a history, examined the patient, evaluated laboratory and imaging results, formulated the assessment and plan  and placed orders. The Patient requires high complexity decision making for assessment and support, frequent evaluation and titration of therapies, application of advanced monitoring technologies and extensive interpretation of multiple databases. The patient has critical illness that could lead imminently to failure of 1 or more organ systems and requires the highest level of physician preparedness to intervene.  Critical Care Time devoted to patient care services described in this note is 35 minutes and is exclusive of time spent in procedures.

## 2016-04-14 NOTE — Addendum Note (Signed)
Addendum  created 04/14/16 0830 by Alvin Critchley, MD   Sign clinical note, SmartForm saved

## 2016-04-15 ENCOUNTER — Inpatient Hospital Stay: Payer: Medicare Other

## 2016-04-15 DIAGNOSIS — M869 Osteomyelitis, unspecified: Secondary | ICD-10-CM

## 2016-04-15 DIAGNOSIS — A419 Sepsis, unspecified organism: Principal | ICD-10-CM

## 2016-04-15 DIAGNOSIS — L089 Local infection of the skin and subcutaneous tissue, unspecified: Secondary | ICD-10-CM

## 2016-04-15 DIAGNOSIS — E1169 Type 2 diabetes mellitus with other specified complication: Secondary | ICD-10-CM

## 2016-04-15 LAB — BLOOD GAS, ARTERIAL
ACID-BASE DEFICIT: 12.1 mmol/L — AB (ref 0.0–2.0)
BICARBONATE: 13 mmol/L — AB (ref 20.0–28.0)
FIO2: 0.3
O2 SAT: 98.8 %
PATIENT TEMPERATURE: 37
PCO2 ART: 27 mmHg — AB (ref 32.0–48.0)
PH ART: 7.29 — AB (ref 7.350–7.450)
RATE: 17 resp/min
pO2, Arterial: 136 mmHg — ABNORMAL HIGH (ref 83.0–108.0)

## 2016-04-15 LAB — MAGNESIUM
MAGNESIUM: 1.8 mg/dL (ref 1.7–2.4)
MAGNESIUM: 1.9 mg/dL (ref 1.7–2.4)

## 2016-04-15 LAB — BASIC METABOLIC PANEL
ANION GAP: 11 (ref 5–15)
BUN: 62 mg/dL — ABNORMAL HIGH (ref 6–20)
CALCIUM: 7.2 mg/dL — AB (ref 8.9–10.3)
CO2: 15 mmol/L — AB (ref 22–32)
Chloride: 116 mmol/L — ABNORMAL HIGH (ref 101–111)
Creatinine, Ser: 3.27 mg/dL — ABNORMAL HIGH (ref 0.61–1.24)
GFR calc non Af Amer: 15 mL/min — ABNORMAL LOW (ref >60)
GFR, EST AFRICAN AMERICAN: 17 mL/min — AB (ref >60)
Glucose, Bld: 196 mg/dL — ABNORMAL HIGH (ref 65–99)
POTASSIUM: 4.3 mmol/L (ref 3.5–5.1)
Sodium: 142 mmol/L (ref 135–145)

## 2016-04-15 LAB — ALBUMIN: Albumin: 2.4 g/dL — ABNORMAL LOW (ref 3.5–5.0)

## 2016-04-15 LAB — PHOSPHORUS
Phosphorus: 6.5 mg/dL — ABNORMAL HIGH (ref 2.5–4.6)
Phosphorus: 6.5 mg/dL — ABNORMAL HIGH (ref 2.5–4.6)

## 2016-04-15 LAB — CBC
HEMATOCRIT: 36.1 % — AB (ref 40.0–52.0)
HEMOGLOBIN: 11.7 g/dL — AB (ref 13.0–18.0)
MCH: 32.1 pg (ref 26.0–34.0)
MCHC: 32.4 g/dL (ref 32.0–36.0)
MCV: 99.1 fL (ref 80.0–100.0)
Platelets: 116 10*3/uL — ABNORMAL LOW (ref 150–440)
RBC: 3.64 MIL/uL — AB (ref 4.40–5.90)
RDW: 15.8 % — ABNORMAL HIGH (ref 11.5–14.5)
WBC: 16 10*3/uL — AB (ref 3.8–10.6)

## 2016-04-15 LAB — GLUCOSE, CAPILLARY
GLUCOSE-CAPILLARY: 130 mg/dL — AB (ref 65–99)
GLUCOSE-CAPILLARY: 159 mg/dL — AB (ref 65–99)
GLUCOSE-CAPILLARY: 219 mg/dL — AB (ref 65–99)
GLUCOSE-CAPILLARY: 219 mg/dL — AB (ref 65–99)

## 2016-04-15 LAB — SURGICAL PATHOLOGY

## 2016-04-15 MED ORDER — VITAL AF 1.2 CAL PO LIQD
1000.0000 mL | ORAL | Status: DC
  Administered 2016-04-15: 1000 mL

## 2016-04-15 MED ORDER — LORAZEPAM 2 MG/ML IJ SOLN: 5.0000 mg/h | INTRAVENOUS | Status: DC

## 2016-04-15 MED ORDER — LORAZEPAM BOLUS VIA INFUSION: 2.0000 mg | INTRAVENOUS | Status: DC | PRN

## 2016-04-15 MED ORDER — MORPHINE BOLUS VIA INFUSION: 5.0000 mg | INTRAVENOUS | Status: DC | PRN

## 2016-04-15 MED ORDER — DEXTROSE 5 % IV SOLN
2.0000 g | INTRAVENOUS | Status: DC
  Filled 2016-04-15: qty 2

## 2016-04-15 MED ORDER — SENNOSIDES 8.8 MG/5ML PO SYRP: 5.0000 mL | ORAL_SOLUTION | Freq: Two times a day (BID) | ORAL | Status: DC

## 2016-04-15 MED ORDER — FENTANYL CITRATE (PF) 100 MCG/2ML IJ SOLN
INTRAMUSCULAR | Status: AC
  Administered 2016-04-15: 100 ug via INTRAVENOUS
  Filled 2016-04-15: qty 2

## 2016-04-15 MED ORDER — DOCUSATE SODIUM 50 MG/5ML PO LIQD: 100.0000 mg | Freq: Two times a day (BID) | ORAL | Status: DC

## 2016-04-15 MED ORDER — FENTANYL CITRATE (PF) 100 MCG/2ML IJ SOLN
100.0000 ug | Freq: Once | INTRAMUSCULAR | Status: AC
  Administered 2016-04-15: 100 ug via INTRAVENOUS

## 2016-04-15 MED ORDER — MORPHINE 100MG IN NS 100ML (1MG/ML) PREMIX INFUSION
2.0000 mg/h | INTRAVENOUS | Status: DC
  Administered 2016-04-15: 2 mg/h via INTRAVENOUS
  Administered 2016-04-16: 6 mg/h via INTRAVENOUS
  Filled 2016-04-15 (×3): qty 100

## 2016-04-15 MED ORDER — LORAZEPAM 2 MG/ML IJ SOLN: 2.0000 mg | INTRAMUSCULAR | Status: DC | PRN

## 2016-04-15 NOTE — Progress Notes (Signed)
1525 pt. Was suctioned prior to extubation for a small amount of thick tan secretions. Per Dr. Merian Capron order, he was extubated. He was placed on a 2 L nasal cannula. He was placed on comfort care.

## 2016-04-15 NOTE — Progress Notes (Signed)
F/u right foot. Pt intubated. Advanced care planning note noted.  Possible plan for terminal extubation. Dressing intact. Change PRN for now. Will follow.

## 2016-04-15 NOTE — Progress Notes (Signed)
PULMONARY / CRITICAL CARE MEDICINE   Name: Troy Orr MRN: [id number] DOB: 1921-09-27    ADMISSION DATE:  05/01/2016   HISTORY OF PRESENT ILLNESS: 82 yo male admitted on 10/4 due to right foot osteomyelitis and septic arthritis right fifth metatarsal phalangeal joint.  On 10/8 pt taken to OR for amputation 20 minutes into procedure pt went into PEA cardiac arrest that lasted approx 20 mins per OR staff pt transferred to ICU 10/8 intubated.  Subjective.  Remains intubated became increasingly agitated, minute volumes low, and O2 sats intermittently decreasing to 90-91% this am.  He has not followed commands throughout the shift.  REVIEW OF SYSTEMS:   Unable to assess pt intubated  VITAL SIGNS: Temp:  [97 F (36.1 C)-98.8 F (37.1 C)] 98.8 F (37.1 C) (10/10 0500) Pulse Rate:  [49-109] 109 (10/10 0500) Resp:  [17-29] 29 (10/10 0500) BP: (90-127)/(54-102) 127/102 (10/10 0500) SpO2:  [92 %-100 %] 92 % (10/10 0500) Arterial Line BP: (75-144)/(49-94) 138/71 (10/10 0000) FiO2 (%):  [30 %-40 %] 30 % (10/10 0323) Weight:  [81 kg (178 lb 9.2 oz)] 81 kg (178 lb 9.2 oz) (10/10 0501) HEMODYNAMICS: CVP:  [10 mmHg-19 mmHg] 13 mmHg VENTILATOR SETTINGS: Vent Mode: Spontaneous FiO2 (%):  [30 %-40 %] 30 % Set Rate:  [16 bmp] 16 bmp Vt Set:  [550 mL] 550 mL PEEP:  [5 cmH20] 5 cmH20 Pressure Support:  [5 cmH20-10 cmH20] 5 cmH20 Plateau Pressure:  [16 cmH20] 16 cmH20  Physical Examination:   GENERAL:critically ill appearing Caucasian male NEURO: pt intermittently agitated, does not follow commands, PERRL HEAD: normocephalic, atraumatic ENT: supple, no JVD PULMONARY: diminished throughout, even, non labored CARDIOVASCULAR: Sinus tach, s1s2, rrr, no M/R/G GASTROINTESTINAL: Soft, nontender, mildly distended, positive bowel sounds, no hepatosplenomegaly.  MUSCULOSKELETAL: No swelling, clubbing, or edema, right foot amputation SKIN: right foot amputation dressing dry and  intact  Imaging Dg Chest Port 1 View  Result Date: 04/14/2016 CLINICAL DATA:  Sepsis, osteomyelitis of the right foot, acute renal insufficiency, status post cardiac arrest. EXAM: PORTABLE CHEST 1 VIEW COMPARISON:  Portable chest x-ray of April 13, 2016 FINDINGS: The lungs are adequately inflated. There is subsegmental atelectasis at the left lung base medially. There is no significant pleural effusion and no pneumothorax. The cardiac silhouette remains enlarged. The central pulmonary vascularity is mildly prominent. There is calcification in the wall of the aortic arch. There are post CABG changes which are stable. There is density that projects over the anterior aspect of the first rib which likely reflects a confluence of normal densities. The endotracheal tube tip lies 6.2 cm above the carina. The esophagogastric tube tip projects below the inferior margin of the image. The left internal jugular venous catheter tip projects over the midportion of the SVC. The bony structures exhibit no acute abnormality. IMPRESSION: Persistent subsegmental atelectasis in the left lower lobe. Cardiomegaly without pulmonary edema. The support tubes are in reasonable position. Aortic atherosclerosis. Electronically Signed   By: David  Martinique M.D.   On: 04/14/2016 07:24   ASSESSMENT/PLAN  PULMONARY Acute Respiratory Failure -wean Fio2 and PEEP as tolerated -continue SAT/SBT as tolerated -maintain O2 sats >92% -CXR in am -prn ABG's  CARDIOVASCULAR Shock-cardiogenic -Maintain map >65 -Telemetry monitoring  RENAL Acute renal failure -Trend BMP's -Strict intake and output -NS 100 ml/hr -Replace electrolytes as indicated  GASTROINTESTINAL No acute issues -Continue tube feeds -Pepcid for PUD  HEMATOLOGIC Anemia -Trend CBC's -Subq heparin for VTE prophylaxis -Monitor for s/sx of bleeding -Transfuse for hgb  of <7  INFECTIOUS Rt foot osteomylitis -Trend WBC's and monitor fever curve -Continue  zosyn -Vascular and Podiatry consulted appreciate input  ENDOCRINE Diabetes Mellitus -SSI  NEUROLOGIC Acute encephalopathy  -Avoid sedating medications when able -CT of Head 10/10 -Lights on during the day -prn Norco for pain  Marda Stalker, [place] Pager [phone] (please enter 7 digits) PCCM Consult Pager [phone] (please enter 7 digits)

## 2016-04-15 NOTE — Progress Notes (Signed)
Nutrition Follow-up  DOCUMENTATION CODES:   Not applicable  INTERVENTION:  -Begin Vital 1.2 @ 6mL/hr via OGT -PEPuP protocol -Provides 1728 calories, 108gm protein, and 1168cc free water  NUTRITION DIAGNOSIS:   Inadequate oral intake related to inability to eat as evidenced by NPO status. -ongoing  GOAL:   Patient will meet greater than or equal to 90% of their needs -will meet with regimen at goal  MONITOR:   Vent status, Labs, Weight trends, I & O's, TF tolerance  ASSESSMENT:   Troy Orr  is a 82 y.o. male with a known history of Arthritis, colon cancer, diabetes mellitus, coronary artery disease, status post cardiac bypass, who presents to the hospital with complaints of right lower extremity redness for the past one or 2 days  Patient is currently intubated on ventilator support MV: 8.8 L/min Temp (24hrs), Avg:98.1 F (36.7 C), Min:97 F (36.1 C), Max:98.8 F (37.1 C) Propofol: none Labs and medications reviewed: CBGs 159-219, Phos 6.5, Mg and K+ WNL,  Levo drip Wt up 8kgs so far - unsure of accuracy -- poor UOP documentation at this time. Discussed in Rounds Monitor for Troy Orr No BM since 10/7  Intake/Output Summary (Last 24 hours) at 04/15/16 0900 Last data filed at 04/15/16 0800  Gross per 24 hour  Intake          2444.27 ml  Output               85 ml  Net          2359.27 ml    Diet Order:  Diet NPO time specified  Skin:  Reviewed, no issues  Last BM:  10/7  Height:   Ht Readings from Last 1 Encounters:  04/08/2016 5\' 9"  (1.753 m)    Weight:   Wt Readings from Last 1 Encounters:  04/15/16 178 lb 9.2 oz (81 kg)    Ideal Body Weight:  72.72 kg  BMI:  Body mass index is 26.37 kg/m.  Estimated Nutritional Needs:   Kcal:  1677 calories  Protein:  100-125 gm  Fluid:  >/= 1.7L  EDUCATION NEEDS:   No education needs identified at this time  Troy Orr. Troy Naab, MS, RD LDN Inpatient Clinical Dietitian Pager [phone]

## 2016-04-15 NOTE — Progress Notes (Signed)
Ch was making his rounds and visited pt in room 8. Provided the ministry of a spiritual presence and emotional support.    04/15/16 1247  Clinical Encounter Type  Visited With Patient and family together  Visit Type Initial  Referral From Nurse  Spiritual Encounters  Spiritual Needs Emotional

## 2016-04-16 MED ORDER — GLYCOPYRROLATE 0.2 MG/ML IJ SOLN
0.4000 mg | Freq: Four times a day (QID) | INTRAMUSCULAR | Status: DC
  Administered 2016-04-16 (×4): 0.4 mg via INTRAVENOUS
  Filled 2016-04-16 (×4): qty 2

## 2016-04-16 NOTE — Progress Notes (Signed)
Pt is on comfort care. Morphine drip at 6mg /hr continues. Pt is resting comfortably.

## 2016-04-16 NOTE — Progress Notes (Signed)
Night shift RN did not document that morphine drip rate increased to 60ml/hr. Per report and at shift change rate was at 77ml/hr. Pt is resting comfortably.

## 2016-04-16 NOTE — Progress Notes (Signed)
CSW continues to follow patient to determine discharge needs.  Ernest Pine, MSW, LCSW, [place] Clinical Social Worker [phone]

## 2016-04-16 NOTE — Progress Notes (Signed)
CH made a follow up visit with Pt. Pt was unresponsive and has transitioned to Palliative care.CH provided silent prayer outside of the door. [place] will follow up as needed.    04/16/16 1400  Clinical Encounter Type  Visited With Patient not available  Visit Type Follow-up;Spiritual support  Referral From Nurse  Spiritual Encounters  Spiritual Needs Prayer

## 2016-04-16 NOTE — Care Management Important Message (Signed)
Important Message  Patient Details  Name: Troy Orr MRN: [id number] Date of Birth: 05-Nov-1921   Medicare Important Message Given:  Yes    Shelbie Ammons, RN 04/16/2016, 9:04 AM

## 2016-04-16 NOTE — Progress Notes (Signed)
Notified Dr Stevenson Clinch that Troy Orr, palliative care NP is asking if referral for residential hospice will be placed or pt to stay in the hospital. Also discussed necessity of foley and central line. Per MD to keep foley for end of life care. To keep central line for medications and to notify NP to make a referral to residential hospice if pt will not expired until 1600. NP is aware.

## 2016-04-17 DIAGNOSIS — D72829 Elevated white blood cell count, unspecified: Secondary | ICD-10-CM

## 2016-05-07 NOTE — Progress Notes (Signed)
Pt passed away at 0355.  Laurance Flatten RN second verifier.  Notified son and received funeral home information.  Body prepared.  Removed IJ, IV, and foley cath.  Dorna Bloom RN

## 2016-05-07 NOTE — Discharge Summary (Signed)
Death Summary  Date of Admission:2016/04/24 Date of Death:02-May-2016 Time of LU:[id number]   Admitting Physician:Dr. Theodoro Grist Discharge Physician:Dr. Roxanne Mins Troy Orr   HPI:April 24, 2016 82 y.o. male with a known history of Arthritis, colon cancer, diabetes mellitus, coronary artery disease, status post cardiac bypass, who presents to the hospital with complaints of right lower extremity redness for the past one or 2 days, noted by skilled nursing facility. The patient denies any pain, admits of right lower extremity swelling. He feels otherwise quite good.   Hospital Course: Patient was admitted to the general medical floor and noted to have a large ulceration of the right foot with suspected infection and peripheral artery disease he was seen by the following consultants nephrology, podiatry, vascular surgery. During his admission he was initially treated with Zosyn and vancomycin IV. Patient was seen by podiatry who noted that he had a severe diabetic wound infection to the fifth metatarsal head, high possibility of osteoarthritis to this area, necrotic and gangrenous changes to the tissue in the region, x-ray of that region showed extensive small vessel calcification throughout the right foot.  Podiatry used a scalpel blade to excise the necrotic tissue from the plantar side of the digit, this was mostly superficial to allow the area to drain which can help improve the infectious process. At this time he will likely need a fifth toe amputation.  She was also seen by the vascular surgery service, for suspected peripheral vascular disease, and there opinion he had sepsis secondary to ostium mellitus of the right foot improving on antibiotics. The vascular surgery service also noted given ulceration and infection of the right foot was suspected peripheral arterial disease there would be difficult revascularization and possible limb loss would be expected if a surgical option was entertained. At that time  angiography for further evaluation and potential treatment was discussed with the patient.  On 04/10/2016 patient was taken to the OR by the podiatry service for a preop diagnosis of osteomyelitis and septic arthritis of the fifth right metatarsal pharyngeal joint, he had a fifth right toe amputated, however 20 minutes into the procedure, he was noted to have a low heart rate and went into PEA cardiac arrest that lasted approximately 20 minutes per the OR staff, post cardiac arrest he was intubated and placed on vasopressors and noted to have severe hypoxia.  Acutely after his cardiac arrest he was noted to be unresponsive with no gag or pupillary reflex. His troponin did peak at 0.13, and to continue to be managed on a ventilator throughout his ICU stay. Family meetings with his healthcare power of attorney, son and other family members stated that given his advanced age and grave condition that he would not want prolonged resuscitative measures such as mechanical ventilation, tracheostomy, PEG tube placement. On 04/15/2016, his son made the patient comfort care and withdrawal of care, with respect to his father's wishes and given no significant clinical improvement or significant neurologic improvement in the setting of little to no sedation medications. Patient was extubated and taken off the mechanical ventilator, he was placed on a morphine drip and transferred to the palliative care ward.  On 05/02/2016 patient passed away at 0355.  Notable labs: 24-Apr-2016 wound culture right foot-Proteus mirabilis, Morganella Morganii 04/18/2016 surgical pathology 5th right metatarsal - acute osetomyelitis   Problem list: Osteomyelitis of fifth metatarsal on right foot Acute sepsis PEA cardiac arrest Acute hypoxic respiratory failure Wound infection Arthritis History of colon cancer Diabetes Cardiogenic shock Toxic and metabolic encephalopathy  Date of Death:04/30/16 Time of LU:[id number]   Troy Boehringer,  MD [facility]

## 2016-05-07 DEATH — deceased

## 2016-05-14 SURGERY — LOWER EXTREMITY ANGIOGRAPHY
Anesthesia: Moderate Sedation | Laterality: Right

## 2017-02-08 IMAGING — US US RENAL
1 series · 14 of 25 positions shown · non-contrast
Comparison: None.

CLINICAL DATA: Renal failure

EXAM:
RENAL ULTRASOUND

[Series 1: us renal · 0.26mm/px · 14 of 49 slices shown]
[im 1/49]
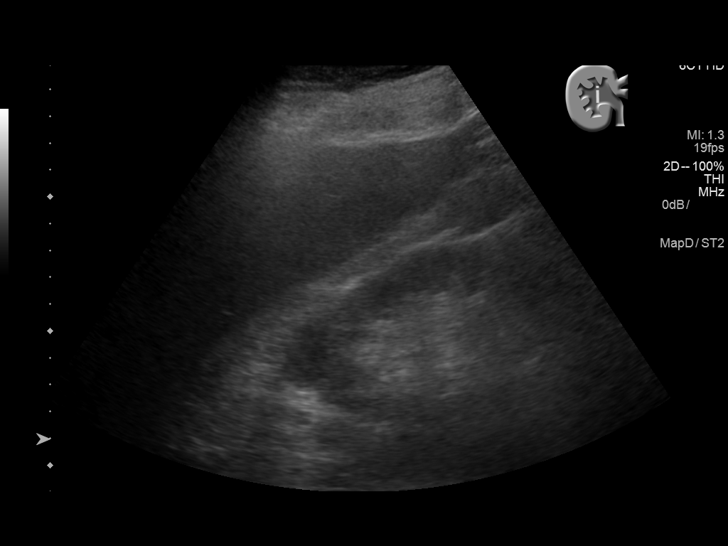
[im 5/49]
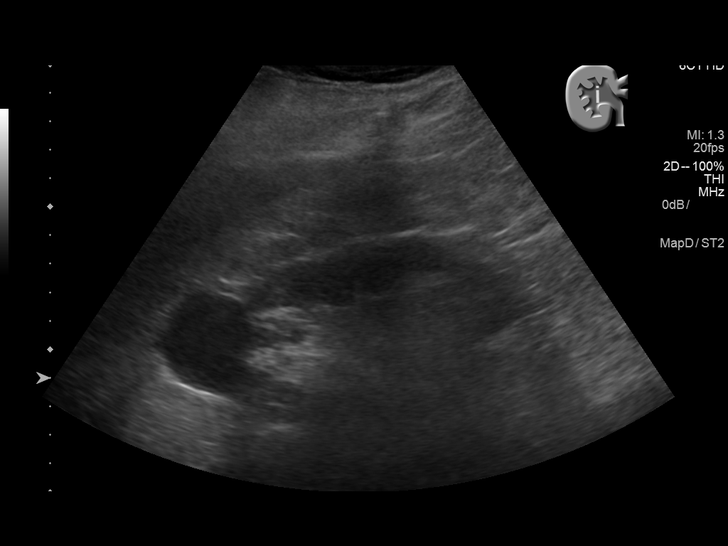
[im 9/49]
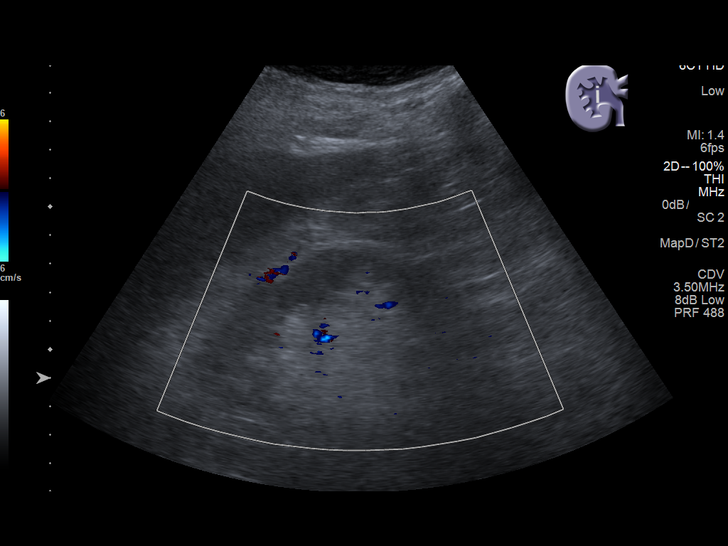
[im 13/49]
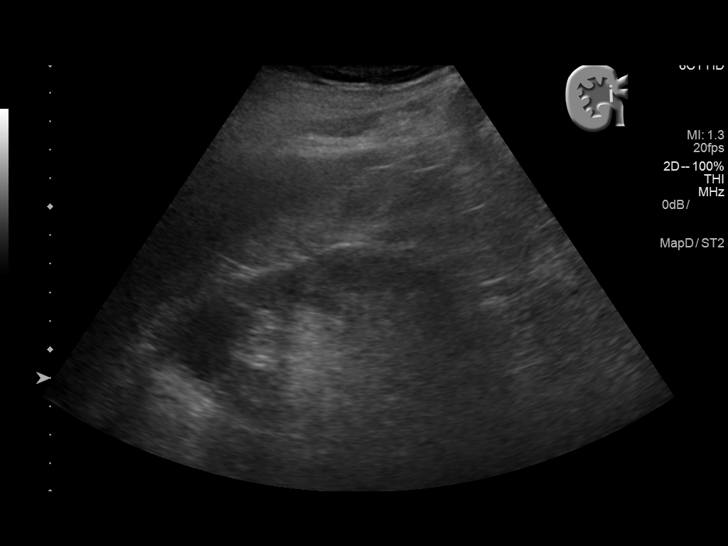
[im 17/49]
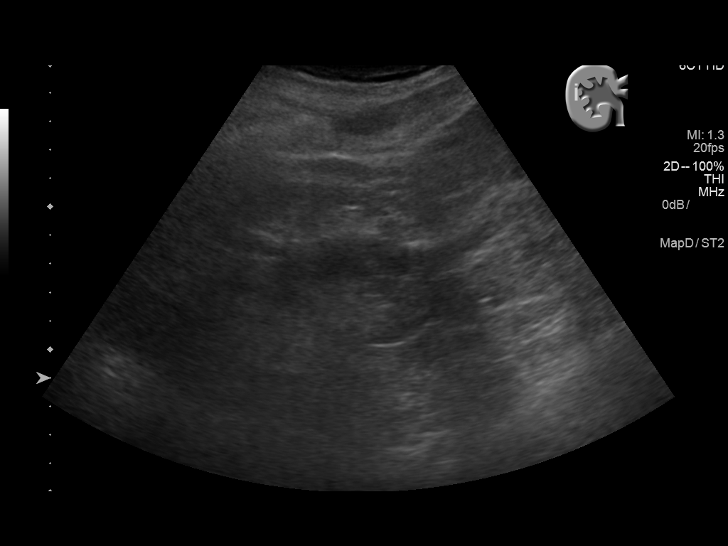
[im 19/49]
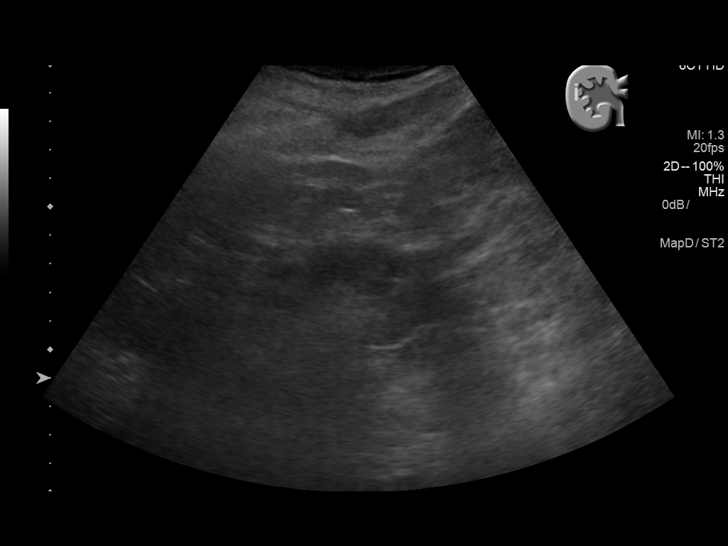
[im 23/49]
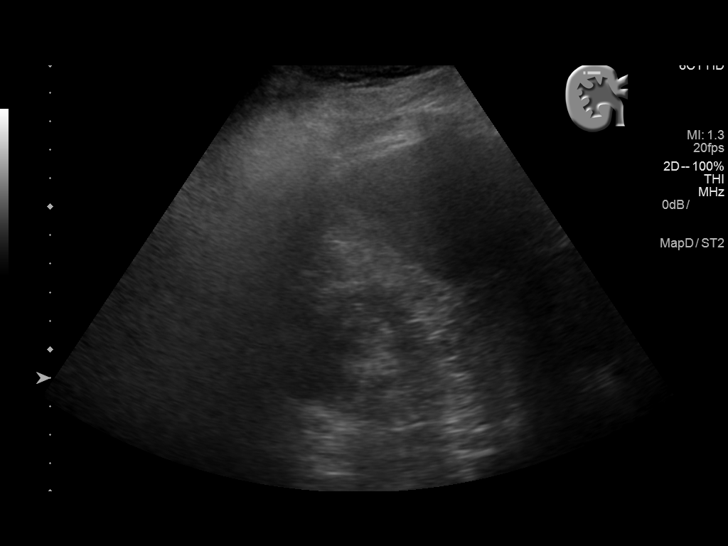
[im 27/49]
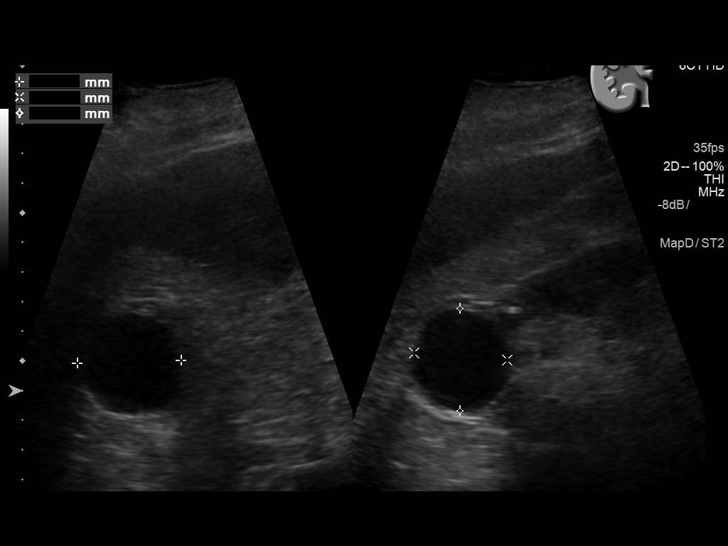
[im 31/49]
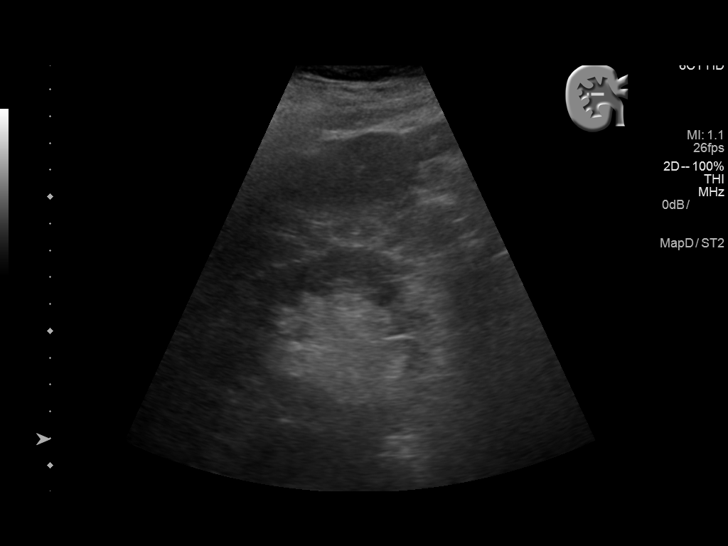
[im 33/49]
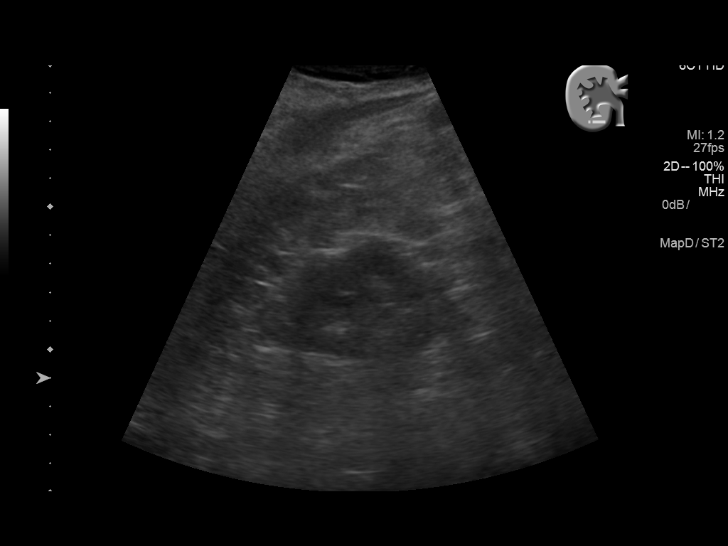
[im 37/49]
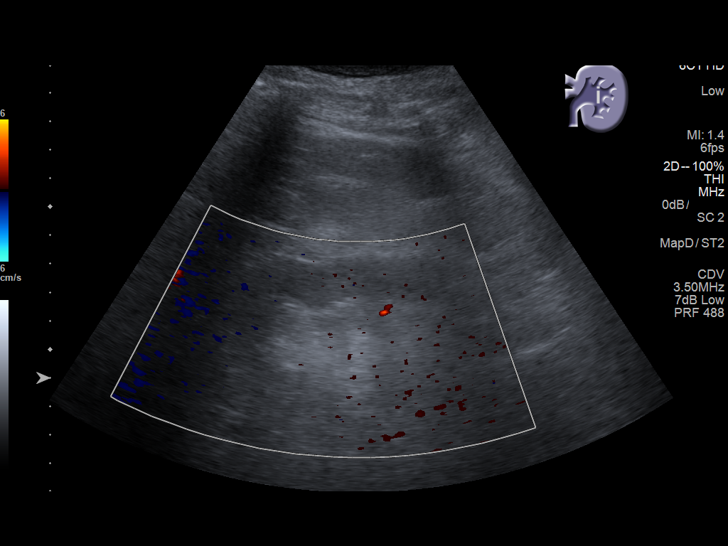
[im 41/49]
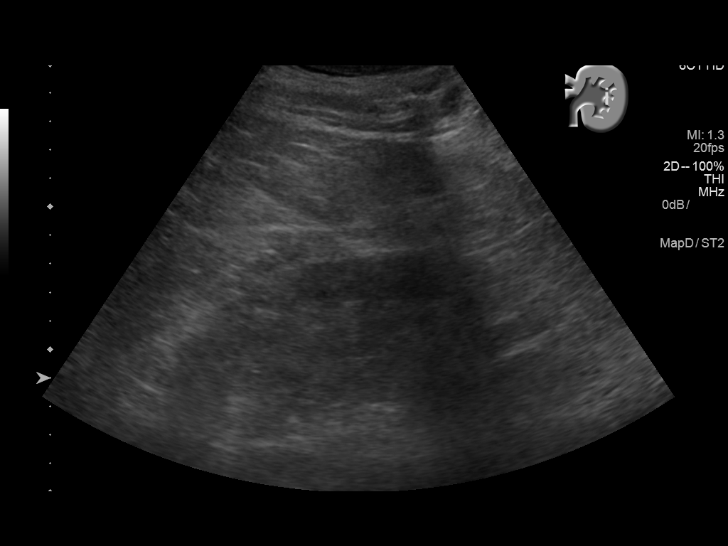
[im 45/49]
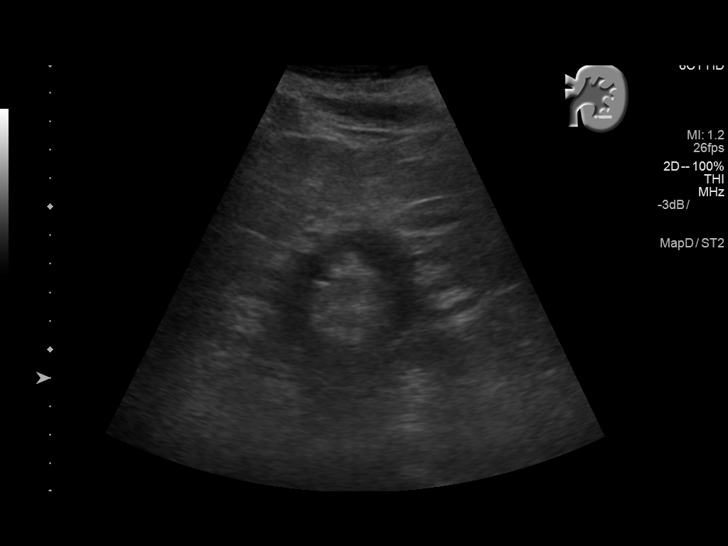
[im 49/49]
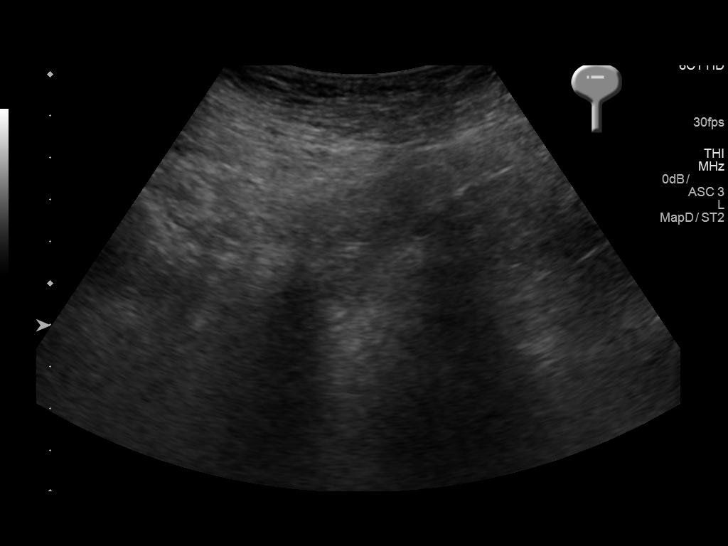

[14 of 25 positions shown; findings below may reference images not displayed]

FINDINGS: Right Kidney:

Length: 11.8 cm. Echogenicity within normal limits. Renal cortical
thickness is at the lower limits of normal. No perinephric fluid or
hydronephrosis visualized. There is a cyst arising from the upper
pole the right kidney measuring 3.2 x 3.5 x 3.5 cm. No
sonographically demonstrable calculus or ureterectasis.

Left Kidney:

Length: 9.8 cm. Echogenicity within normal limits. Renal cortical
thickness at lower limits of normal. No mass, perinephric fluid, or
hydronephrosis visualized. No sonographically demonstrable calculus
or ureterectasis.

Bladder:

Decompressed with Foley catheter and cannot be assessed.
IMPRESSION: Cyst arising from upper pole right kidney. No obstructing foci on
either side.

Borderline size discrepancy between kidneys. Significance of this
finding is uncertain. This finding potentially could indicate a
degree of renal artery stenosis on the left. In this regard,
question whether patient is hypertensive.

## 2017-03-05 IMAGING — DX DG CHEST 1V PORT
1 series · 1 of 1 positions shown · non-contrast
Comparison: Portable chest x-ray April 13, 2016

CLINICAL DATA: Sepsis, osteomyelitis of the right foot, acute renal
insufficiency, status post cardiac arrest.

EXAM:
PORTABLE CHEST 1 VIEW

[chest ap]
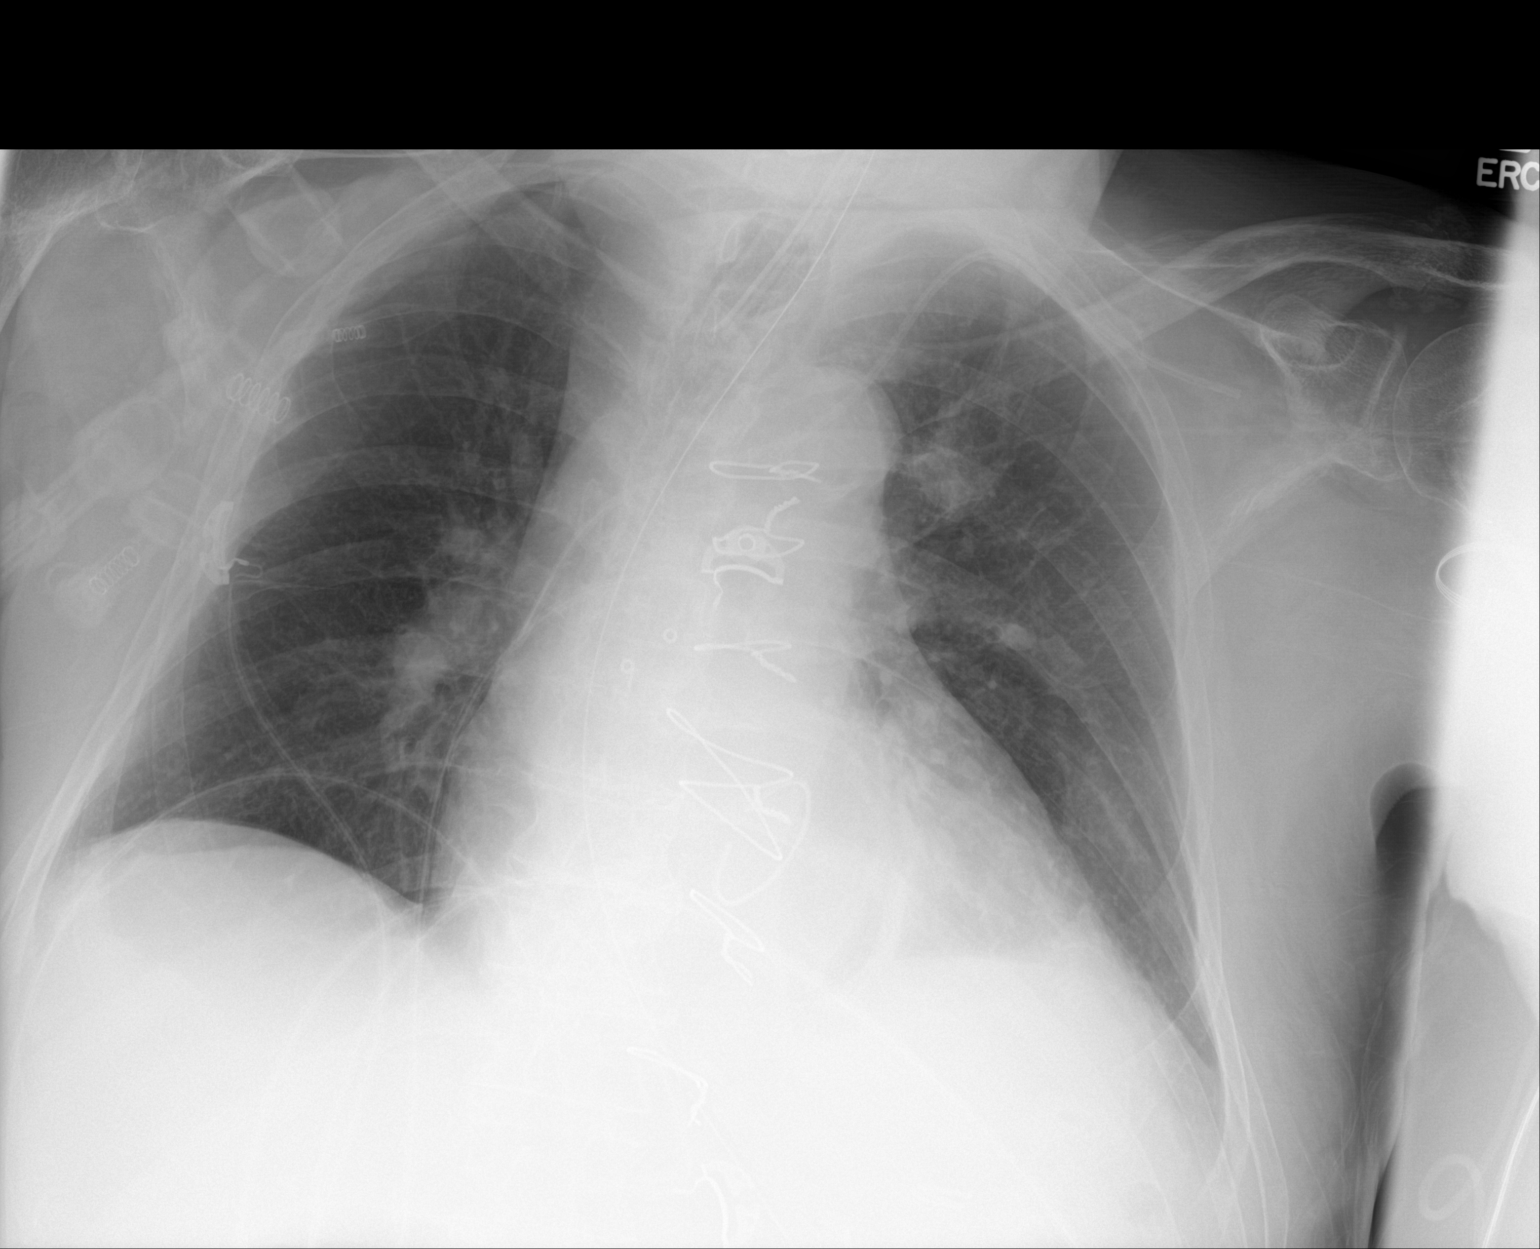

[1 of 1 positions shown; findings below may reference images not displayed]

FINDINGS: The lungs are adequately inflated. There is subsegmental atelectasis
at the left lung base medially. There is no significant pleural
effusion and no pneumothorax. The cardiac silhouette remains
enlarged. The central pulmonary vascularity is mildly prominent.
There is calcification in the wall of the aortic arch. There are
post CABG changes which are stable. There is density that projects
over the anterior aspect of the first rib which likely reflects a
confluence of normal densities. The endotracheal tube tip lies
cm above the carina. The esophagogastric tube tip projects below the
inferior margin of the image. The left internal jugular venous
catheter tip projects over the midportion of the SVC. The bony
structures exhibit no acute abnormality.
IMPRESSION: Persistent subsegmental atelectasis in the left lower lobe.
Cardiomegaly without pulmonary edema. The support tubes are in
reasonable position.

Aortic atherosclerosis.
